# Patient Record
Sex: Female | Born: 1975 | Hispanic: Yes | Marital: Single | Smoking: Never smoker
Health system: Southern US, Community
[De-identification: ages and names within clinical notes are randomized; demographics above are authoritative.]

## PROBLEM LIST (undated history)

## (undated) ENCOUNTER — Inpatient Hospital Stay (HOSPITAL_COMMUNITY): Payer: Self-pay

## (undated) DIAGNOSIS — M545 Low back pain, unspecified: Secondary | ICD-10-CM

## (undated) DIAGNOSIS — R7303 Prediabetes: Secondary | ICD-10-CM

## (undated) DIAGNOSIS — D649 Anemia, unspecified: Secondary | ICD-10-CM

## (undated) DIAGNOSIS — I959 Hypotension, unspecified: Secondary | ICD-10-CM

## (undated) DIAGNOSIS — J45909 Unspecified asthma, uncomplicated: Secondary | ICD-10-CM

## (undated) DIAGNOSIS — K219 Gastro-esophageal reflux disease without esophagitis: Secondary | ICD-10-CM

## (undated) HISTORY — DX: Gastro-esophageal reflux disease without esophagitis: K21.9

## (undated) HISTORY — DX: Low back pain, unspecified: M54.50

## (undated) HISTORY — DX: Prediabetes: R73.03

## (undated) HISTORY — DX: Low back pain: M54.5

## (undated) HISTORY — PX: WISDOM TOOTH EXTRACTION: SHX21

## (undated) HISTORY — DX: Unspecified asthma, uncomplicated: J45.909

## (undated) HISTORY — PX: OTHER SURGICAL HISTORY: SHX169

## (undated) HISTORY — DX: Hypotension, unspecified: I95.9

## (undated) HISTORY — DX: Anemia, unspecified: D64.9

---

## 2001-04-13 ENCOUNTER — Encounter: Admission: RE | Admit: 2001-04-13 | Discharge: 2001-04-13 | Payer: Self-pay | Admitting: Obstetrics

## 2001-12-19 ENCOUNTER — Encounter: Admission: RE | Admit: 2001-12-19 | Discharge: 2001-12-19 | Payer: Self-pay | Admitting: *Deleted

## 2002-04-16 ENCOUNTER — Encounter: Admission: RE | Admit: 2002-04-16 | Discharge: 2002-04-16 | Payer: Self-pay | Admitting: Internal Medicine

## 2002-04-23 ENCOUNTER — Encounter: Admission: RE | Admit: 2002-04-23 | Discharge: 2002-04-23 | Payer: Self-pay | Admitting: Internal Medicine

## 2002-08-23 ENCOUNTER — Encounter: Admission: RE | Admit: 2002-08-23 | Discharge: 2002-08-23 | Payer: Self-pay | Admitting: Internal Medicine

## 2002-10-02 ENCOUNTER — Encounter: Admission: RE | Admit: 2002-10-02 | Discharge: 2002-10-02 | Payer: Self-pay | Admitting: Internal Medicine

## 2002-11-08 ENCOUNTER — Encounter: Admission: RE | Admit: 2002-11-08 | Discharge: 2002-11-08 | Payer: Self-pay | Admitting: Internal Medicine

## 2004-03-13 ENCOUNTER — Encounter: Admission: RE | Admit: 2004-03-13 | Discharge: 2004-03-13 | Payer: Self-pay | Admitting: Internal Medicine

## 2004-03-20 ENCOUNTER — Ambulatory Visit: Payer: Self-pay | Admitting: Internal Medicine

## 2004-05-12 ENCOUNTER — Encounter (INDEPENDENT_AMBULATORY_CARE_PROVIDER_SITE_OTHER): Payer: Self-pay | Admitting: *Deleted

## 2004-05-12 ENCOUNTER — Ambulatory Visit: Payer: Self-pay | Admitting: Internal Medicine

## 2004-06-02 ENCOUNTER — Ambulatory Visit: Payer: Self-pay | Admitting: Obstetrics & Gynecology

## 2005-06-24 ENCOUNTER — Ambulatory Visit: Payer: Self-pay | Admitting: Internal Medicine

## 2005-08-26 ENCOUNTER — Ambulatory Visit: Payer: Self-pay | Admitting: Internal Medicine

## 2005-08-31 ENCOUNTER — Ambulatory Visit: Payer: Self-pay | Admitting: Internal Medicine

## 2006-01-13 ENCOUNTER — Ambulatory Visit: Payer: Self-pay | Admitting: Internal Medicine

## 2006-07-26 ENCOUNTER — Ambulatory Visit: Payer: Self-pay | Admitting: Internal Medicine

## 2006-07-27 ENCOUNTER — Ambulatory Visit: Payer: Self-pay | Admitting: Hospitalist

## 2006-07-28 ENCOUNTER — Encounter (INDEPENDENT_AMBULATORY_CARE_PROVIDER_SITE_OTHER): Payer: Self-pay | Admitting: Internal Medicine

## 2006-08-25 ENCOUNTER — Encounter (INDEPENDENT_AMBULATORY_CARE_PROVIDER_SITE_OTHER): Payer: Self-pay | Admitting: Pulmonary Disease

## 2006-08-25 ENCOUNTER — Ambulatory Visit: Payer: Self-pay | Admitting: Internal Medicine

## 2006-08-25 DIAGNOSIS — R143 Flatulence: Secondary | ICD-10-CM

## 2006-08-25 DIAGNOSIS — R142 Eructation: Secondary | ICD-10-CM

## 2006-08-25 DIAGNOSIS — R141 Gas pain: Secondary | ICD-10-CM | POA: Insufficient documentation

## 2006-11-25 ENCOUNTER — Encounter: Payer: Self-pay | Admitting: *Deleted

## 2006-11-25 ENCOUNTER — Encounter (INDEPENDENT_AMBULATORY_CARE_PROVIDER_SITE_OTHER): Payer: Self-pay | Admitting: Pulmonary Disease

## 2006-11-25 ENCOUNTER — Ambulatory Visit (HOSPITAL_COMMUNITY): Admission: RE | Admit: 2006-11-25 | Discharge: 2006-11-25 | Payer: Self-pay | Admitting: *Deleted

## 2006-11-25 ENCOUNTER — Ambulatory Visit: Payer: Self-pay | Admitting: *Deleted

## 2006-12-06 ENCOUNTER — Ambulatory Visit: Payer: Self-pay | Admitting: Internal Medicine

## 2006-12-08 ENCOUNTER — Encounter: Admission: RE | Admit: 2006-12-08 | Discharge: 2006-12-08 | Payer: Self-pay | Admitting: Internal Medicine

## 2006-12-14 ENCOUNTER — Ambulatory Visit (HOSPITAL_COMMUNITY): Admission: RE | Admit: 2006-12-14 | Discharge: 2006-12-14 | Payer: Self-pay | Admitting: Pulmonary Disease

## 2006-12-14 ENCOUNTER — Ambulatory Visit: Payer: Self-pay | Admitting: Cardiology

## 2007-01-11 ENCOUNTER — Ambulatory Visit: Payer: Self-pay | Admitting: Internal Medicine

## 2007-01-11 DIAGNOSIS — M546 Pain in thoracic spine: Secondary | ICD-10-CM | POA: Insufficient documentation

## 2007-01-26 ENCOUNTER — Encounter (INDEPENDENT_AMBULATORY_CARE_PROVIDER_SITE_OTHER): Payer: Self-pay | Admitting: *Deleted

## 2007-01-26 ENCOUNTER — Ambulatory Visit: Payer: Self-pay | Admitting: Internal Medicine

## 2007-01-26 ENCOUNTER — Ambulatory Visit (HOSPITAL_COMMUNITY): Admission: RE | Admit: 2007-01-26 | Discharge: 2007-01-26 | Payer: Self-pay | Admitting: Internal Medicine

## 2007-01-26 LAB — CONVERTED CEMR LAB
ALT: 13 units/L (ref 0–35)
AST: 18 units/L (ref 0–37)
Albumin: 4.4 g/dL (ref 3.5–5.2)
BUN: 12 mg/dL (ref 6–23)
Basophils Absolute: 0 10*3/uL (ref 0.0–0.1)
Calcium: 8.9 mg/dL (ref 8.4–10.5)
Chloride: 104 meq/L (ref 96–112)
Hemoglobin: 12.8 g/dL (ref 12.0–15.0)
Lymphocytes Relative: 27 % (ref 12–46)
Lymphs Abs: 2.3 10*3/uL (ref 0.7–3.3)
Monocytes Absolute: 0.6 10*3/uL (ref 0.2–0.7)
Neutro Abs: 5.3 10*3/uL (ref 1.7–7.7)
Potassium: 3.8 meq/L (ref 3.5–5.3)
RDW: 13.2 % (ref 11.5–14.0)
Sed Rate: 10 mm/hr (ref 0–22)
WBC: 8.3 10*3/uL (ref 4.0–10.5)

## 2007-02-09 ENCOUNTER — Ambulatory Visit: Payer: Self-pay | Admitting: *Deleted

## 2007-03-29 ENCOUNTER — Encounter: Admission: RE | Admit: 2007-03-29 | Discharge: 2007-05-02 | Payer: Self-pay | Admitting: *Deleted

## 2007-05-12 ENCOUNTER — Encounter (INDEPENDENT_AMBULATORY_CARE_PROVIDER_SITE_OTHER): Payer: Self-pay | Admitting: Internal Medicine

## 2007-06-07 ENCOUNTER — Ambulatory Visit: Payer: Self-pay | Admitting: Internal Medicine

## 2007-10-12 ENCOUNTER — Ambulatory Visit: Payer: Self-pay | Admitting: Hospitalist

## 2007-10-12 ENCOUNTER — Encounter: Payer: Self-pay | Admitting: Internal Medicine

## 2007-10-14 LAB — CONVERTED CEMR LAB
ALT: 9 units/L (ref 0–35)
AST: 16 units/L (ref 0–37)
Albumin: 4 g/dL (ref 3.5–5.2)
Basophils Absolute: 0 10*3/uL (ref 0.0–0.1)
Calcium: 8.5 mg/dL (ref 8.4–10.5)
Chloride: 104 meq/L (ref 96–112)
Lymphocytes Relative: 40 % (ref 12–46)
Neutro Abs: 3.2 10*3/uL (ref 1.7–7.7)
Platelets: 143 10*3/uL — ABNORMAL LOW (ref 150–400)
Potassium: 3.8 meq/L (ref 3.5–5.3)
RDW: 13.3 % (ref 11.5–15.5)
Sed Rate: 11 mm/hr (ref 0–22)

## 2007-10-17 ENCOUNTER — Ambulatory Visit: Payer: Self-pay | Admitting: Hospitalist

## 2007-10-17 ENCOUNTER — Encounter: Payer: Self-pay | Admitting: Internal Medicine

## 2007-11-09 ENCOUNTER — Ambulatory Visit: Payer: Self-pay | Admitting: Infectious Disease

## 2007-12-29 ENCOUNTER — Ambulatory Visit: Payer: Self-pay | Admitting: Internal Medicine

## 2007-12-29 DIAGNOSIS — L255 Unspecified contact dermatitis due to plants, except food: Secondary | ICD-10-CM | POA: Insufficient documentation

## 2008-01-07 IMAGING — CR DG CHEST 2V
2 series · 2 of 2 positions shown · non-contrast
Comparison: None

CLINICAL DATA: Chest pain

CHEST - 2 VIEW

[w chest pa]
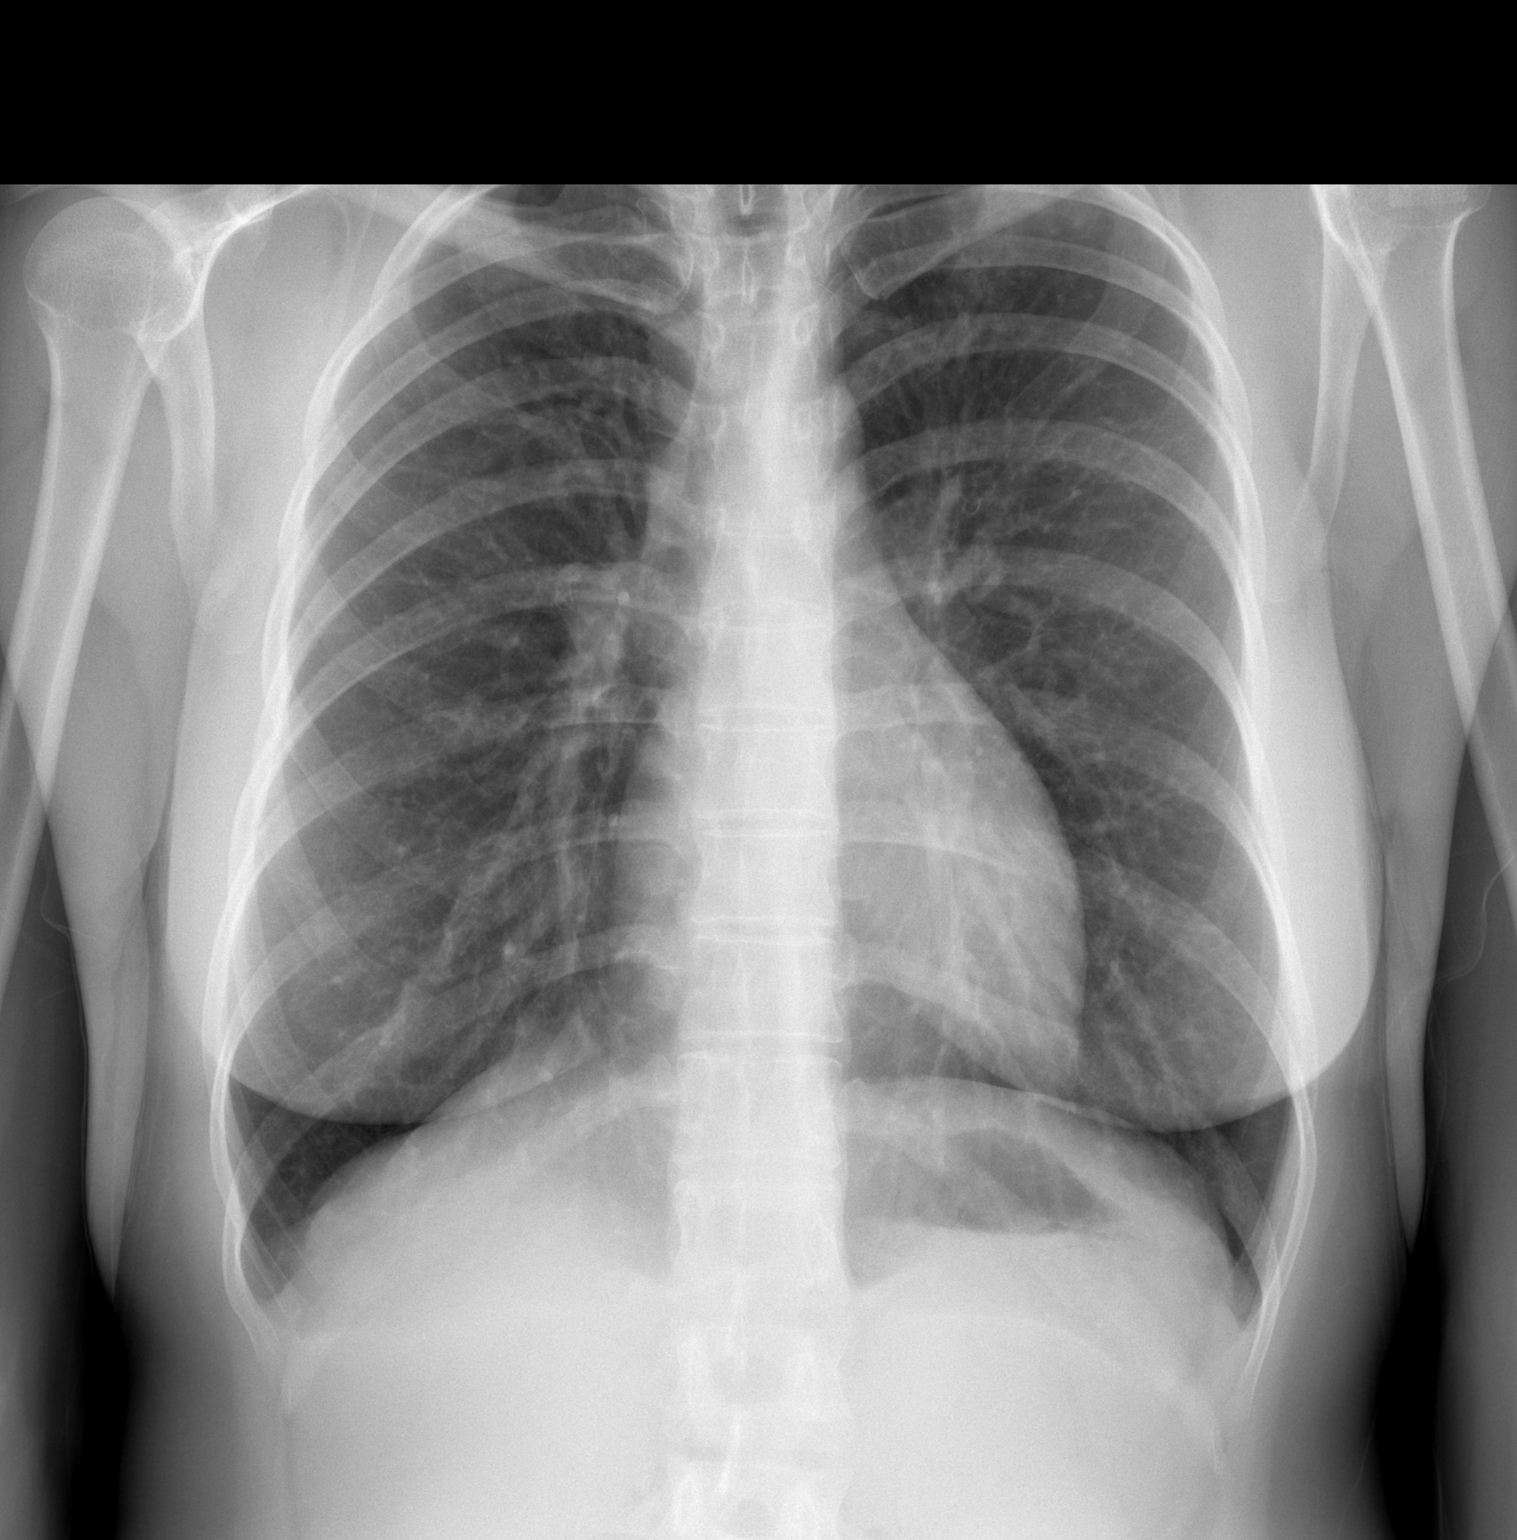

[w chest lat]
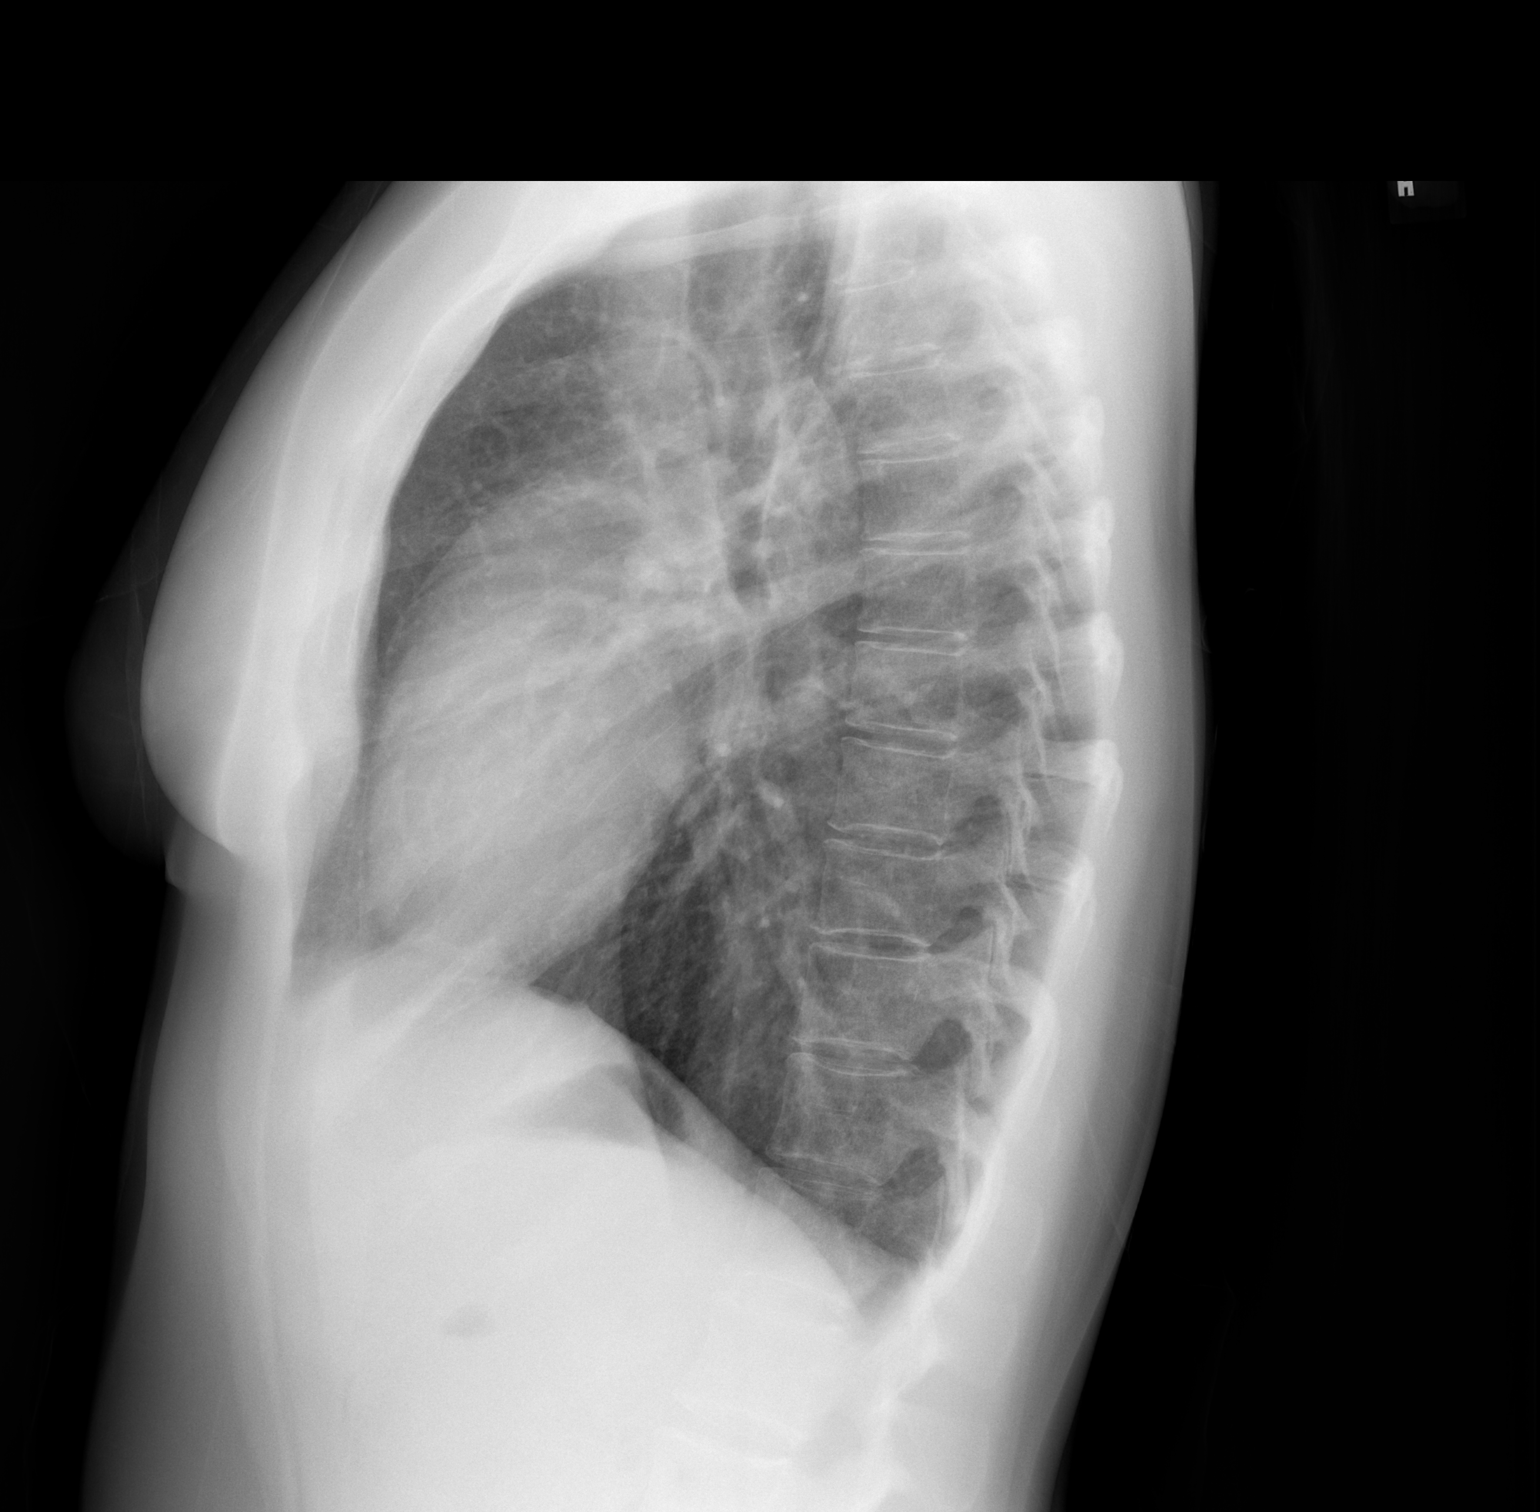

[2 of 2 positions shown; findings below may reference images not displayed]

FINDINGS: Heart size normal.

No effusion or edema.

No airspace opacities noted.

IMPRESSION

No active cardiopulmonary disease

## 2008-03-04 ENCOUNTER — Ambulatory Visit: Payer: Self-pay | Admitting: *Deleted

## 2008-04-04 ENCOUNTER — Other Ambulatory Visit: Admission: RE | Admit: 2008-04-04 | Discharge: 2008-04-04 | Payer: Self-pay | Admitting: Obstetrics and Gynecology

## 2008-04-04 ENCOUNTER — Ambulatory Visit: Payer: Self-pay | Admitting: Obstetrics and Gynecology

## 2008-04-18 ENCOUNTER — Ambulatory Visit: Payer: Self-pay | Admitting: Obstetrics and Gynecology

## 2008-05-07 ENCOUNTER — Encounter: Payer: Self-pay | Admitting: Internal Medicine

## 2008-09-05 ENCOUNTER — Encounter: Payer: Self-pay | Admitting: Internal Medicine

## 2008-09-05 ENCOUNTER — Ambulatory Visit: Payer: Self-pay | Admitting: *Deleted

## 2008-09-05 DIAGNOSIS — N898 Other specified noninflammatory disorders of vagina: Secondary | ICD-10-CM | POA: Insufficient documentation

## 2008-09-05 LAB — CONVERTED CEMR LAB: Chlamydia, DNA Probe: NEGATIVE

## 2008-10-24 ENCOUNTER — Ambulatory Visit: Payer: Self-pay | Admitting: Obstetrics and Gynecology

## 2008-10-24 ENCOUNTER — Encounter (INDEPENDENT_AMBULATORY_CARE_PROVIDER_SITE_OTHER): Payer: Self-pay | Admitting: Family Medicine

## 2008-10-25 ENCOUNTER — Encounter: Payer: Self-pay | Admitting: Internal Medicine

## 2008-11-08 ENCOUNTER — Encounter (INDEPENDENT_AMBULATORY_CARE_PROVIDER_SITE_OTHER): Payer: Self-pay | Admitting: Internal Medicine

## 2008-11-08 ENCOUNTER — Ambulatory Visit: Payer: Self-pay | Admitting: Internal Medicine

## 2008-11-08 DIAGNOSIS — J029 Acute pharyngitis, unspecified: Secondary | ICD-10-CM | POA: Insufficient documentation

## 2008-12-09 ENCOUNTER — Telehealth: Payer: Self-pay | Admitting: Internal Medicine

## 2009-03-20 ENCOUNTER — Ambulatory Visit: Payer: Self-pay | Admitting: Internal Medicine

## 2009-05-01 ENCOUNTER — Encounter: Payer: Self-pay | Admitting: Advanced Practice Midwife

## 2009-05-01 ENCOUNTER — Ambulatory Visit: Payer: Self-pay | Admitting: Obstetrics and Gynecology

## 2009-07-17 ENCOUNTER — Ambulatory Visit: Payer: Self-pay | Admitting: Internal Medicine

## 2009-09-26 ENCOUNTER — Ambulatory Visit: Payer: Self-pay | Admitting: Internal Medicine

## 2009-09-26 DIAGNOSIS — N3000 Acute cystitis without hematuria: Secondary | ICD-10-CM | POA: Insufficient documentation

## 2009-09-26 LAB — CONVERTED CEMR LAB
Bilirubin Urine: NEGATIVE
Glucose, Urine, Semiquant: NEGATIVE
Leukocytes, UA: NEGATIVE
Nitrite: NEGATIVE
Protein, U semiquant: NEGATIVE
Protein, ur: NEGATIVE mg/dL
Urine Glucose: NEGATIVE mg/dL
Urobilinogen, UA: 0.2 (ref 0.0–1.0)

## 2009-11-06 ENCOUNTER — Ambulatory Visit: Payer: Self-pay | Admitting: Obstetrics and Gynecology

## 2009-11-11 ENCOUNTER — Ambulatory Visit (HOSPITAL_COMMUNITY): Admission: RE | Admit: 2009-11-11 | Discharge: 2009-11-11 | Payer: Self-pay | Admitting: Obstetrics and Gynecology

## 2009-11-27 ENCOUNTER — Ambulatory Visit: Payer: Self-pay | Admitting: Obstetrics and Gynecology

## 2010-04-08 ENCOUNTER — Ambulatory Visit: Payer: Self-pay | Admitting: Internal Medicine

## 2010-04-08 DIAGNOSIS — R109 Unspecified abdominal pain: Secondary | ICD-10-CM | POA: Insufficient documentation

## 2010-04-08 LAB — CONVERTED CEMR LAB
AST: 19 units/L (ref 0–37)
Alkaline Phosphatase: 62 units/L (ref 39–117)
BUN: 13 mg/dL (ref 6–23)
Basophils Relative: 0 % (ref 0–1)
Creatinine, Ser: 0.63 mg/dL (ref 0.40–1.20)
Eosinophils Absolute: 0.1 10*3/uL (ref 0.0–0.7)
Eosinophils Relative: 1 % (ref 0–5)
Glucose, Bld: 103 mg/dL — ABNORMAL HIGH (ref 70–99)
HCT: 37.4 % (ref 36.0–46.0)
Hemoglobin: 12.7 g/dL (ref 12.0–15.0)
MCHC: 34 g/dL (ref 30.0–36.0)
MCV: 91 fL (ref >=78.0)
Monocytes Absolute: 0.7 10*3/uL (ref 0.1–1.0)
Monocytes Relative: 11 % (ref 3–12)
RBC: 4.11 M/uL (ref 3.87–5.11)

## 2010-05-22 ENCOUNTER — Telehealth (INDEPENDENT_AMBULATORY_CARE_PROVIDER_SITE_OTHER): Payer: Self-pay | Admitting: *Deleted

## 2010-05-25 ENCOUNTER — Ambulatory Visit: Payer: Self-pay | Admitting: Internal Medicine

## 2010-05-29 ENCOUNTER — Ambulatory Visit (HOSPITAL_COMMUNITY): Admission: RE | Admit: 2010-05-29 | Discharge: 2010-05-29 | Payer: Self-pay | Admitting: Internal Medicine

## 2010-06-18 ENCOUNTER — Ambulatory Visit: Payer: Self-pay | Admitting: Internal Medicine

## 2010-08-18 NOTE — Progress Notes (Signed)
Summary: PREVENTIVE COLONOSCOPY  Phone Note Outgoing Call   Call placed by: Shon Hough,  May 22, 2010 3:23 PM Summary of Call: Patient is under the age of 46.  No information in EMR or ECHART in regards to a colonscopy. Initial call taken by: Shon Hough,  May 22, 2010 3:24 PM

## 2010-08-18 NOTE — Assessment & Plan Note (Signed)
Summary: urine light red w/ pain [mkj]   Vital Signs:  Patient profile:   35 year old female Height:      64 inches (162.56 cm) Weight:      124.7 pounds BMI:     21.48 Temp:     98.1 degrees F (36.72 degrees C) oral Pulse rate:   78 / minute BP sitting:   101 / 57  (left arm)  Vitals Entered By: Chinita Pester RN (September 26, 2009 3:26 PM) CC: Started this morning, urine red and burning . 1 week prior, she had adb. pain, nausea, and high  temp. Is Patient Diabetic? No Pain Assessment Patient in pain? yes     Location: abdomen Intensity: 3 Type: dull Onset of pain  Intermittent Nutritional Status BMI of 19 -24 = normal  Have you ever been in a relationship where you felt threatened, hurt or afraid?No   Does patient need assistance? Functional Status Self care Ambulation Normal Comments Interpreter w/pt.   Primary Care Provider:  Hartley Barefoot MD  CC:  Started this morning, urine red and burning . 1 week prior, she had adb. pain, nausea, and and high  temp.Kelly Atkinson  History of Present Illness: 35 year old relatively healthy spanish speaking women with interpreter in the room comes to the clinic for new onset pain during micturition since morning. She also complains of lower abdominal pain since this morning. No c/o fever, chills. LMP was Feb 15th and she does not complain of any vaginal discharge. No other complaints today.  Depression History:      The patient denies a depressed mood most of the day and a diminished interest in her usual daily activities.         Preventive Screening-Counseling & Management  Alcohol-Tobacco     Alcohol drinks/day: 0     Smoking Status: never  Caffeine-Diet-Exercise     Caffeine use/day: 0     Does Patient Exercise: yes     Type of exercise: WALKING AND PUSH UPS     Exercise (avg: min/session): 30-60     Times/week: 3  Problems Prior to Update: 1)  Acute Pharyngitis  (ICD-462) 2)  Vaginal Discharge  (ICD-623.5) 3)  Poison Ivy  Dermatitis  (ICD-692.6) 4)  Health Maintenance Exam  (ICD-V70.0) 5)  Back Pain, Thoracic Region  (ICD-724.1) 6)  Abdominal Bloating  (ICD-787.3)  Medications Prior to Update: 1)  Prilosec Otc 20 Mg Tbec (Omeprazole Magnesium) .... Take 1 Tablet By Mouth Once A Day  Current Medications (verified): 1)  Prilosec Otc 20 Mg Tbec (Omeprazole Magnesium) .... Take 1 Tablet By Mouth Once A Day 2)  Ciprofloxacin Hcl 250 Mg Tabs (Ciprofloxacin Hcl) .... Take 1 Tablet By Mouth Two Times A Day 3)  Phenazo 200 Mg Tabs (Phenazopyridine Hcl) .... Take 1 Tablet By Mouth Three Times A Day After Meals  Allergies (verified): No Known Drug Allergies  Past History:  Past Medical History: Last updated: 02/09/2007 GERD Hx of abnormal PAP Smears, 2003 and 2005, last one in 08/2005 normal. Patient [facility] her PAP.  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 She is standing and does not have to slouch or bend her back while working. She has been working at [employer] fo 4 years.   Risk Factors: Alcohol Use: 0 (09/26/2009) Caffeine Use: 0 (09/26/2009) Exercise: yes (  09/26/2009)  Risk Factors: Smoking Status: never (09/26/2009)  Review of Systems      See HPI  Physical Exam  Additional Exam:  Gen: AOx3, in no acute distress Eyes: PERRL, EOMI ENT:MMM, No erythema noted in posterior pharynx Neck: No JVD, No LAP Chest: CTAB with  good respiratory effort CVS: regular rhythmic rate, NO M/R/G, S1 S2 normal Abdo: soft,ND, BS+x4, lower abdominal tenderness, No hepatosplenomegaly EXT: No odema noted Neuro: Non focal, gait is normal Skin: no rashes noted.    Impression & Recommendations:  Problem # 1:  ACUTE CYSTITIS (ICD-595.0) Acute uncomplicated cystitis with typical symptoms. Her updated medication list for this problem  includes:    Ciprofloxacin Hcl 250 Mg Tabs (Ciprofloxacin hcl) .Kelly Atkinson... Take 1 tablet by mouth two times a day  Orders: T-Urinalysis Dipstick only ([id number]) T-Urinalysis ([id number])  Problem # 2:  HEALTH MAINTENANCE EXAM (ICD-V70.0) Uptodate with vaccination.  Complete Medication List: 1)  Prilosec Otc 20 Mg Tbec (Omeprazole magnesium) .... Take 1 tablet by mouth once a day 2)  Ciprofloxacin Hcl 250 Mg Tabs (Ciprofloxacin hcl) .... Take 1 tablet by mouth two times a day 3)  Phenazo 200 Mg Tabs (Phenazopyridine hcl) .... Take 1 tablet by mouth three times a day after meals  Patient Instructions: 1)  Follow up with an appointmnet as needed. 2)  Take your antibiotic as prescribed until ALL of it is gone, but stop if you develop a rash or swelling and contact our office as soon as possible. Prescriptions: PHENAZO 200 MG TABS (PHENAZOPYRIDINE HCL) Take 1 tablet by mouth three times a day after meals  #9 x 0   Entered and Authorized by:   Lars Mage MD   Signed by:   Lars Mage MD on 09/26/2009   Method used:   Print then Give to Patient   RxID:   [id number] CIPROFLOXACIN HCL 250 MG TABS (CIPROFLOXACIN HCL) Take 1 tablet by mouth two times a day  #6 x 0   Entered and Authorized by:   Lars Mage MD   Signed by:   Lars Mage MD on 09/26/2009   Method used:   Print then Give to Patient   RxID:   [id number]  Process Orders Check Orders Results:     [facility]: ABN not required for this insurance Tests Sent for requisitioning (September 27, 2009 12:13 PM):     09/26/2009: [facility] -- T-Urinalysis [[id number]] (signed)    Prevention & Chronic Care Immunizations   Influenza vaccine: Fluvax 3+  (07/17/2009)    Tetanus booster: Not documented    Pneumococcal vaccine: Not documented  Other Screening   Pap smear: NEGATIVE FOR INTRAEPITHELIAL LESIONS OR MALIGNANCY.  (10/24/2008)   Smoking status: never  (09/26/2009)  Lipids   Total  Cholesterol: Not documented   LDL: Not documented   LDL Direct: Not documented   HDL: Not documented   Triglycerides: Not documented      Resource handout printed.   Laboratory Results   Urine Tests  Date/Time Recieved: 09/26/09  4:10PM Date/Time Reported: 09/26/09   4:10PM  Routine Urinalysis   Color: lt. yellow Glucose: negative   (Normal Range: Negative) Bilirubin: negative   (Normal Range: Negative) Ketone: negative   (Normal Range: Negative) Spec. Gravity: <1.005   (Normal Range: 1.003-1.035) Blood: trace-intact   (Normal Range: Negative) pH: 5.0   (Normal Range: 5.0-8.0) Protein: negative   (Normal Range: Negative) Urobilinogen: 0.2   (Normal Range: 0-1) Nitrite: negative   (Normal Range: Negative) Leukocyte  Esterace: negative   (Normal Range: Negative)

## 2010-08-18 NOTE — Assessment & Plan Note (Signed)
Summary: RA/NEEDS ROUTINE CHECKUP/CH   Vital Signs:  Patient profile:   35 year old female Weight:      131.8 pounds BMI:     22.71 O2 Sat:      100 % on RA Temp:     98.3 degrees F oral Pulse rate:   73 / minute BP sitting:   94 / 61  (right arm)  Vitals Entered By: Cynda Familia Duncan Dull) (April 08, 2010 2:52 PM)  O2 Flow:  RA CC: pain in the stomach,med refill- prilosec, no medicines for two months Is Patient Diabetic? No Pain Assessment Patient in pain? yes     Location: stomach Intensity: 4 Onset of pain  off and on Nutritional Status BMI of 19 -24 = normal  Have you ever been in a relationship where you felt threatened, hurt or afraid?No   Does patient need assistance? Functional Status Self care Ambulation Normal   Primary Care Provider:  Hartley Barefoot MD  CC:  pain in the stomach, med refill- prilosec, and no medicines for two months.  History of Present Illness: This is  35 year old female with h/o back pain   Abdominal pain: epigastric,  sharp in character, 5/10 in severity,  radiation to the side and then it goes down, no significant aggreviating or alleviating factors. Iit is always present. Worth with eating. It started a week ago. Complains about nausea with no vomit. Some diarrhea  2 weeks ago with no blood. Since then mucus in the stool.  Had epigastric pain in the past was put on Omeprazole 20 mg once a day but was not taking it since 2 month. Denies fevers or chills, recent travel, changes in diet.  Taking advil for pain occasionally.    Preventive Screening-Counseling & Management  Alcohol-Tobacco     Alcohol drinks/day: 0     Smoking Status: never  Allergies: No Known Drug Allergies  Physical Exam  General:  Well-developed,well-nourished,in no acute distress; alert,appropriate and cooperative throughout examination Lungs:  Normal respiratory effort, chest expands symmetrically. Lungs are clear to auscultation, no crackles or  wheezes. Heart:  Normal rate and regular rhythm. S1 and S2 normal without gallop, murmur, click, rub or other extra sounds. Abdomen:  Bowel sounds positive,abdomen soft and without masses, organomegaly or hernias noted. Epigastic tenderness, with no guarding, no rigidity.  Pulses:  R and L carotid,radial,dorsalis pedis and posterior tibial pulses are full and equal bilaterally Extremities:  No clubbing, cyanosis, edema, or deformity noted with normal full range of motion of all joints.     Impression & Recommendations:  Problem # 1:  ABDOMINAL PAIN (ICD-789.00) Epigastric pain most likey due to GERD. DD include duodenitis, gastritis and pancreatitis but higly unlikely with no significant positive finding on PE and presentation. Patient was prescribed Omeprazole 40 mg by mouth once a day and we will evaluate her in one month. If no improvement consider further work up for epigstric pain. Will check CBC, Cmet and Lipase today.   Orders: T-Comprehensive Metabolic Panel [phone]) T-CBC w/Diff [phone]) T-Lipase [phone])  Complete Medication List: 1)  Omeprazole 40 Mg Cpdr (Omeprazole) .... Take one tablet by mouth once a day.  Patient Instructions: 1)  Please schedule a follow-up appointment in 1 month with Dr Loistine Chance.  2)  Avoid foods high in acid (tomatoes, citrus juices, spicy foods). Avoid eating within two hours of lying down or before exercising. Do not over eat; try smaller more frequent meals. Elevate head of bed twelve inches when sleeping.  Do not take Aleve, Advil and other NSAIDs. Take Tylenol for pain.  Prescriptions: OMEPRAZOLE 40 MG CPDR (OMEPRAZOLE) Take one tablet by mouth once a day.  #30 x 3   Entered and Authorized by:   Almyra Deforest MD   Signed by:   Almyra Deforest MD on 04/08/2010   Method used:   Print then Give to Patient   RxID:   [id number]   Prevention & Chronic Care Immunizations   Influenza vaccine: Fluvax 3+  (07/17/2009)    Tetanus  booster: Not documented   Td booster deferral: Not indicated  (04/08/2010)   Tetanus booster due: 03/20/2012    Pneumococcal vaccine: Not documented  Other Screening   Pap smear: NEGATIVE FOR INTRAEPITHELIAL LESIONS OR MALIGNANCY.  (10/24/2008)   Pap smear due: 04/30/2010   Smoking status: never  (04/08/2010)  Lipids   Total Cholesterol: Not documented   LDL: Not documented   LDL Direct: Not documented   HDL: Not documented   Triglycerides: Not documented   Nursing Instructions: Give Flu vaccine today    Process Orders Check Orders Results:     [facility]: ABN not required for this insurance Order queued for requisitioning for [facility]: April 08, 2010 3:40 PM  Tests Sent for requisitioning (April 08, 2010 3:43 PM):     04/08/2010: [facility] -- T-Comprehensive Metabolic Panel [[id number]] (signed)     04/08/2010: [facility] -- T-CBC w/Diff [[id number]] (signed)     04/08/2010: [facility] -- Catalina Gravel [phone] (signed)     Appended Document: flu vaccine//kg    Clinical Lists Changes  Orders: Added new Service order of Admin 1st Vaccine ([id number]) - Signed Added new Service order of Flu Vaccine 73yrs + [phone]) - Signed Observations: Added new observation of FLU VAX VIS: 02/10/2010 version (04/08/2010 16:29) Added new observation of FLU VAXLOT: [id number] (04/08/2010 16:29) Added new observation of FLU VAXMFR: [employer] (04/08/2010 16:29) Added new observation of FLU VAX EXP: 01/16/2011 (04/08/2010 16:29) Added new observation of FLU VAX DSE: 0.37ml (04/08/2010 16:29) Added new observation of FLU VAX: Fluvax 3+ (04/08/2010 16:29)Flu Vaccine Consent Questions     Do you have a history of severe allergic reactions to this vaccine? no    Any prior history of allergic reactions to egg and/or gelatin? no    Do you have a sensitivity to the preservative Thimersol? no    Do you have a past  history of Guillan-Barre Syndrome? no    Do you currently have an acute febrile illness? no    Have you ever had a severe reaction to latex? no    Vaccine information given and explained to patient? yes    Are you currently pregnant? no    Lot Number:[id number]   Exp Date:01/16/2011   Manufacturer: [employer]    Site Given  Left Deltoid IM.Cynda Familia [facility])  April 08, 2010 4:29 PM  observation of FLU VAX DSE: 0.40ml (04/08/2010 16:29) Added new observation of FLU VAX: Fluvax 3+ (04/08/2010 16:29)     .opcflu

## 2010-08-18 NOTE — Assessment & Plan Note (Signed)
Summary: EST-1 MONTH F/U VISIT/CH   Vital Signs:  Patient profile:   35 year old female Height:      64 inches (162.56 cm) Weight:      128.6 pounds (58.45 kg) BMI:     22.15 Temp:     97.1 degrees F oral Pulse rate:   73 / minute BP sitting:   91 / 59  (right arm) Cuff size:   regular  Vitals Entered By: Chinita Pester RN (May 25, 2010 3:02 PM) CC: F/U visit; states abdomen pain has resolved. Is Patient Diabetic? No Pain Assessment Patient in pain? no      Nutritional Status BMI of 19 -24 = normal  Have you ever been in a relationship where you felt threatened, hurt or afraid?No   Does patient need assistance? Functional Status Self care Ambulation Normal Comments Interpreter w/pt.   Primary Care Provider:  Almyra Deforest MD  CC:  F/U visit; states abdomen pain has resolved.Marland Kitchen  History of Present Illness: This is  35 year old female with h/o back pain  who is here for a follow up appointment for abdominal pain.  She was started one month ago  on Omeprazol 40 mg for possible GERD and patient noted that she was taking it regularly. She reports that the pain is resolved but she still has a feeling of bloating with episodes of nausea especially with food. She further noted that has metal like take whenever she is stressed out. Patient denies vomiting, diarrhea, constipation, fevers or chills.   Note: CBC, CMET and Lipase at the last visit was within normal limits    Depression History:      The patient denies a depressed mood most of the day and a diminished interest in her usual daily activities.         Preventive Screening-Counseling & Management  Alcohol-Tobacco     Alcohol drinks/day: 0     Smoking Status: never  Caffeine-Diet-Exercise     Caffeine use/day: 0     Does Patient Exercise: yes     Type of exercise: WALKING AND PUSH UPS     Exercise (avg: min/session): 30-60     Times/week: 3  Current Medications (verified): 1)  Omeprazole 40 Mg Cpdr  (Omeprazole) .... Take One Tablet By Mouth Once A Day.  Allergies: No Known Drug Allergies  Review of Systems  The patient denies fever, weight loss, chest pain, dyspnea on exertion, peripheral edema, prolonged cough, hemoptysis, abdominal pain, and hematochezia.    Physical Exam  General:  Well-developed,well-nourished,in no acute distress; alert,appropriate and cooperative throughout examination Lungs:  Normal respiratory effort, chest expands symmetrically. Lungs are clear to auscultation, no crackles or wheezes. Heart:  Normal rate and regular rhythm. S1 and S2 normal without gallop, murmur, click, rub or other extra sounds. Abdomen:  Bowel sounds positive,abdomen soft and non-tender without masses, organomegaly or hernias noted. Pulses:  R and L carotid,radial, dorsalis pedis and posterior tibial pulses are full and equal bilaterally Extremities:  No clubbing, cyanosis, edema, or deformity noted with normal full range of motion of all joints.   Cervical Nodes:  No lymphadenopathy noted   Impression & Recommendations:  Problem # 1:  ABDOMINAL PAIN (ICD-789.00) Pain improved but patient still complaines about bloating and episodes of nauea with food. Furthermore reports about reflux especially if stressed out.  DD include GERD, Gastritis, Peptic ulcer disease  and Gallstones causing cholecystitis especially in the setting of nausea with food. Will get an ultrasound  of the Gallbladder and manage accordingly. If ultrasound is unrevealing I would consider EGD for further evaluation for possible ulcer disease or H. pylori infection.   Orders: Ultrasound (Ultrasound)  Complete Medication List: 1)  Omeprazole 40 Mg Cpdr (Omeprazole) .... Take one tablet by mouth once a day.  Patient Instructions: 1)  Please schedule a follow-up appointment in 2 weeks.   Orders Added: 1)  Ultrasound [Ultrasound] 2)  Est. Patient Level III [16109]    Prevention & Chronic Care Immunizations    Influenza vaccine: Fluvax 3+  (04/08/2010)    Tetanus booster: Not documented   Td booster deferral: Not indicated  (04/08/2010)   Tetanus booster due: 03/20/2012    Pneumococcal vaccine: Not documented  Other Screening   Pap smear: NEGATIVE FOR INTRAEPITHELIAL LESIONS OR MALIGNANCY.  (10/24/2008)   Pap smear due: 04/30/2010   Smoking status: never  (05/25/2010)  Lipids   Total Cholesterol: Not documented   LDL: Not documented   LDL Direct: Not documented   HDL: Not documented   Triglycerides: Not documented

## 2010-08-18 NOTE — Assessment & Plan Note (Signed)
Summary: ACUTE/ILLATH/R/S 2 WEEK RECHECK FROM 06-08-10/CH   Vital Signs:  Patient profile:   35 year old female Height:      64 inches Weight:      126.8 pounds BMI:     21.84 Temp:     97.8 degrees F oral Pulse rate:   78 / minute BP sitting:   96 / 58  (right arm)  Vitals Entered By: Filomena Jungling NT II (June 18, 2010 3:09 PM) CC: followup visit Is Patient Diabetic? No Pain Assessment Patient in pain? no      Nutritional Status BMI of 19 -24 = normal  Does patient need assistance? Functional Status Self care Ambulation Normal   Primary Care Provider:  Almyra Deforest MD  CC:  followup visit.  History of Present Illness: Reports resolution of LBP and abdominal pain. no other concerns.  Preventive Screening-Counseling & Management  Alcohol-Tobacco     Alcohol drinks/day: 0     Smoking Status: never  Caffeine-Diet-Exercise     Caffeine use/day: 0     Does Patient Exercise: yes     Type of exercise: WALKING AND PUSH UPS     Exercise (avg: min/session): 30-60     Times/week: 3  Current Problems (verified): 1)  Abdominal Pain  (ICD-789.00) 2)  Acute Cystitis  (ICD-595.0) 3)  Acute Pharyngitis  (ICD-462) 4)  Vaginal Discharge  (ICD-623.5) 5)  Poison Ivy Dermatitis  (ICD-692.6) 6)  Health Maintenance Exam  (ICD-V70.0) 7)  Back Pain, Thoracic Region  (ICD-724.1) 8)  Abdominal Bloating  (ICD-787.3)  Medications Prior to Update: 1)  Omeprazole 40 Mg Cpdr (Omeprazole) .... Take One Tablet By Mouth Once A Day.  Current Medications (verified): 1)  Omeprazole 40 Mg Cpdr (Omeprazole) .... Take One Tablet By Mouth Once A Day.  Allergies (verified): No Known Drug Allergies  Past History:  Past medical, surgical, family and social histories (including risk factors) reviewed for relevance to current acute and chronic problems.  Past Medical History: Reviewed history from 02/09/2007 and no changes required. GERD Hx of abnormal PAP Smears, 2003 and 2005, last  one in 08/2005 normal. Patient [facility] to [facility] department for her PAP.  Low back pain  Family History: Reviewed history from 12/29/2007 and no changes required. 1 son, healthy  Social History: Reviewed history from 01/26/2007 and no changes required. 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 She is standing and does not have to slouch or bend her back while working. She has been working at [employer] fo 4 years.   Review of Systems  The patient denies anorexia, fever, weight loss, weight gain, vision loss, decreased hearing, hoarseness, chest pain, syncope, dyspnea on exertion, peripheral edema, prolonged cough, headaches, hemoptysis, abdominal pain, melena, hematochezia, severe indigestion/heartburn, hematuria, incontinence, genital sores, muscle weakness, suspicious skin lesions, transient blindness, difficulty walking, depression, unusual weight change, abnormal bleeding, enlarged lymph nodes, and breast masses.         Last menses 06/09/2010 x 5 days; regular. Uses intravaginal contraception.  Physical Exam  General:  Well-developed,well-nourished,in no acute distress; alert,appropriate and cooperative throughout examination Mouth:  no gingival abnormalities.   Neck:  supple, full ROM, and no masses.   Chest Wall:  no deformities.   Lungs:  Normal respiratory effort, chest expands symmetrically. Lungs are clear to auscultation, no crackles or wheezes. Heart:  Normal rate and regular rhythm. S1 and S2 normal without gallop, murmur, click, rub or other extra sounds. Abdomen:  Bowel  sounds positive,abdomen soft and non-tender without masses, organomegaly or hernias noted. Rectal:  No external abnormalities noted. Normal sphincter tone. No rectal masses or tenderness. Msk:  No deformity or scoliosis noted of thoracic or lumbar spine.   Pulses:  R and L radial, dorsalis pedis and posterior tibial pulses are full and equal bilaterally Extremities:  No clubbing,  cyanosis, edema, or deformity noted with normal full range of motion of all joints.   Neurologic:  alert & oriented X3, strength normal in all extremities, sensation intact to light touch, sensation intact to pinprick, gait normal, and DTRs symmetrical and normal.   Skin:  Intact without suspicious lesions or rashes Psych:  Cognition and judgment appear intact. Alert and cooperative with normal attention span and concentration. No apparent delusions, illusions, hallucinations   Impression & Recommendations:  Problem # 1:  ABDOMINAL PAIN (ICD-789.00) Assessment Improved  likely IBS.Patient denies any "red flags" symptoms. Patient declined any medical therapy at this time. Instructed to call if changes her mind. Relaxation techniques discussed. Continue with Omeprazole for now on  as needed basis. Advised to decreased intake of fatty and spicy foods. I do not feel that the patient needs EGD at present. However, if Sx persist or worsen, need a further work up.  Discussed symptom control with the patient.   Problem # 2:  BACK PAIN, THORACIC REGION (ICD-724.1) Assessment: Improved  Likely of  a MSK nature. Resolved. Instructed to avoid heavy lifting; stretching exercises demonstrated the patient. Tyelnol OTC on as needed .  Discussed use of moist heat or ice, modified activities, medications, and stretching/strengthening exercises. Back care instructions given. To be seen in 2 weeks if no improvement; sooner if worsening of symptoms.   Complete Medication List: 1)  Omeprazole 40 Mg Cpdr (Omeprazole) .... Take one tablet by mouth once a day.   Orders Added: 1)  Est. Patient Level III [30865]    Prevention & Chronic Care Immunizations   Influenza vaccine: Fluvax 3+  (04/08/2010)    Tetanus booster: Not documented   Td booster deferral: Not indicated  (04/08/2010)   Tetanus booster due: 03/20/2012    Pneumococcal vaccine: Not documented  Other Screening   Pap smear: NEGATIVE FOR  INTRAEPITHELIAL LESIONS OR MALIGNANCY.  (10/24/2008)   Pap smear due: 04/30/2010   Smoking status: never  (06/18/2010)  Lipids   Total Cholesterol: Not documented   LDL: Not documented   LDL Direct: Not documented   HDL: Not documented   Triglycerides: Not documented

## 2010-11-26 ENCOUNTER — Encounter: Payer: Self-pay | Admitting: Internal Medicine

## 2010-11-26 ENCOUNTER — Ambulatory Visit (INDEPENDENT_AMBULATORY_CARE_PROVIDER_SITE_OTHER): Payer: Self-pay | Admitting: Internal Medicine

## 2010-11-26 VITALS — BP 98/60 | HR 60 | Temp 98.3°F | Ht 64.0 in | Wt 126.3 lb

## 2010-11-26 DIAGNOSIS — M79609 Pain in unspecified limb: Secondary | ICD-10-CM

## 2010-11-26 DIAGNOSIS — K089 Disorder of teeth and supporting structures, unspecified: Secondary | ICD-10-CM

## 2010-11-26 DIAGNOSIS — K0889 Other specified disorders of teeth and supporting structures: Secondary | ICD-10-CM

## 2010-11-26 DIAGNOSIS — M79673 Pain in unspecified foot: Secondary | ICD-10-CM | POA: Insufficient documentation

## 2010-11-26 DIAGNOSIS — B351 Tinea unguium: Secondary | ICD-10-CM | POA: Insufficient documentation

## 2010-11-26 MED ORDER — FLUCONAZOLE 200 MG PO TABS: ORAL_TABLET | ORAL | Status: DC

## 2010-11-26 MED ORDER — IBUPROFEN 600 MG PO TABS: 600.0000 mg | ORAL_TABLET | Freq: Four times a day (QID) | ORAL | Status: AC | PRN

## 2010-11-26 NOTE — Progress Notes (Signed)
  Subjective:    Patient ID: Kelly Atkinson, female    DOB: March 23, 1976, 35 y.o.   MRN: [id number]  HPI [name] is a pleasant 35 year old woman with no significant past medical history who comes in the clinic with chief complaint of bilateral foot and calf pain for about 2 weeks. Her work involves standing 7 hours every day and about 2 weeks before she started having pain in both her foods when she was working. The pain is sharp and shooting starts on the plantar aspect of the heel and goes up to the middle of the foot bilaterally. Denies any recent or past trauma or history of arthritis. The pain aggravated with weightbearing and gets better with rest and she never woke up at night with pain. First step in the morning his painful for her and the pain remains the same or gets a little worse with every step. Denies any sensation changes or weakness in bilateral lower extremities. Denies any fever, chills, night sweats, palpitations, headache, chest pain, shallow breath, abdominal pain, urinary abnormalities. She has an IUD in place and is not on any birth control pills.  Review of Systems    as per history of present illness Objective:   Physical Exam    Constitutional: Vital signs reviewed.  Patient is a well-developed and well-nourished in no acute distress and cooperative with exam. Alert and oriented x3.  Head: Normocephalic and atraumatic Ear: TM normal bilaterally Mouth: no erythema or exudates, MMM Eyes: PERRL, EOMI, conjunctivae normal, No scleral icterus.  Neck: Supple, Trachea midline normal ROM, No JVD, mass, thyromegaly, or carotid bruit present.  Cardiovascular: RRR, S1 normal, S2 normal, no MRG, pulses symmetric and intact bilaterally Pulmonary/Chest: CTAB, no wheezes, rales, or rhonchi Abdominal: Soft. Non-tender, non-distended, bowel sounds are normal, no masses, organomegaly, or guarding present.  GU: no CVA tenderness Musculoskeletal: No joint deformities, erythema,  or stiffness. Mild tenderness to palpation on the plantar aspect of bilateral feet from heel to middle of the foot and tenderness to squeezing of the calves bilaterally.  Neurological: A&O x3, Strenght is normal and symmetric bilaterally, cranial nerve II-XII are grossly intact, no focal motor deficit, sensory intact to light touch bilaterally.  Skin: Warm, dry and intact. Right big toenail onychomycosis. No rash, cyanosis, or clubbing.       Assessment & Plan:

## 2010-11-26 NOTE — Assessment & Plan Note (Signed)
She has a feeling that his multiple teeth about 17 years back in [place]. 1 of a lower molar filling is coming out. Also has a broken piece of that teeth. Will refer her to dental clinic for further evaluation and management.

## 2010-11-26 NOTE — Assessment & Plan Note (Signed)
Right toenail onychomycosis. We'll prescribe fluconazole 200 mg by mouth once weekly for 20 weeks and follow up after that.

## 2010-11-26 NOTE — Patient Instructions (Signed)
Please make followup appointment in 4-6 months. If the pain in your foot does not get better in next 2-3 weeks or he starts having pain in ankles and wrist, call the clinic to make an appointment. Please take ibuprofen as prescribed, 600 mg 3 times a day with meals for pain. Also for your infection in the big toe on right, we'll give your antibiotic named fluconazole 200 mg one tablet every Sunday for next 20 Sundays.

## 2010-11-26 NOTE — Assessment & Plan Note (Signed)
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 Also early RA cannot be ruled out as she's also complaining of problem with differential joint pain and bilaterally about 3 weeks before in the morning and of the complaints of intermittent bilateral ankle pains. Discussed in depth with her about the differential diagnosis and we will take Advil 600 mg by mouth 3 times a day for [redacted] weeks along with Prilosec 20 mg daily. If pain gets worse or she has pain in the wrist or ankle, call the clinic and make an appointment for further evaluation for rheumatoid arthritis.

## 2010-12-04 NOTE — Group Therapy Note (Signed)
Kelly Atkinson, Kelly Atkinson       ACCOUNT NO.:  [id number]   MEDICAL RECORD NO.:  [id number]          PATIENT TYPE:  WOC   LOCATION:  [facility]                   FACILITY:  [facility]   PHYSICIAN:  Elsie Lincoln, MD      DATE OF BIRTH:  [birth year]   DATE OF SERVICE:  06/02/2004                                    CLINIC NOTE   CHIEF COMPLAINT:  Referral of abnormal Pap smears.   HISTORY OF PRESENT ILLNESS:  Kelly Atkinson is a 35 year old Hispanic  female who per her physician's report had had an abnormal Pap smear  approximately 2 years ago and underwent colposcopy which evidently per the  patient showed some normal cellularity.  She had repeat Pap smear at a [facility] screening in April which she reports was abnormal, and then had a  repeat Pap smear with her primary care physician at the outpatient clinic  which showed ASCUS and the patient was referred here.  Of note, she started  her menses at age 48.  Her last menstrual period was May 22, 2004 and  says that they are very regular.  She denies risk factors such as tobacco  smoking or family history, though she has had four or five sexual partners  in her lifetime, and she is a G2 P2.  She is not currently on contraceptive.   PAST MEDICAL HISTORY:  For prior cervical abnormality but we do not have any  documentation of the specific abnormality.   FAMILY HISTORY:  Not significant.   SOCIAL HISTORY:  She does not smoke or drink or use drugs.   REVIEW OF SYSTEMS:  The patient says she gets occasional nausea and vomiting  prior to her menses and occasionally will have a vaginal odor.   OBJECTIVE:  VITAL SIGNS:  Temperature 97.7, pulse 68, blood pressure 90/63,  weight 120 pounds, height 5 feet.  PELVIC:  External genitalia appears normal.  She has pink vaginal mucosa  with a thin white discharge.  Cervical os appears fairly normal with  visualization of the squamocolumnar junction.  Do not see any evidence of  cysts, etc.   Cervical sweep was done for HPV testing.   ASSESSMENT AND PLAN:  A 35 year old Hispanic female with a history of  abnormal Pap smears, the last being atypical squamous cells of undetermined  significance.  HPV testing was sent today and she will be contacted for  further follow-up, and otherwise she has been scheduled for a 20-month repeat  Pap.      CM/MEDQ  D:  06/02/2004  T:  06/02/2004  Job:  [id number]

## 2010-12-10 ENCOUNTER — Encounter: Payer: Self-pay | Admitting: Internal Medicine

## 2010-12-10 DIAGNOSIS — M545 Low back pain, unspecified: Secondary | ICD-10-CM | POA: Insufficient documentation

## 2011-01-30 ENCOUNTER — Encounter: Payer: Self-pay | Admitting: Internal Medicine

## 2011-02-11 ENCOUNTER — Encounter: Payer: Self-pay | Admitting: Internal Medicine

## 2011-02-11 ENCOUNTER — Ambulatory Visit (INDEPENDENT_AMBULATORY_CARE_PROVIDER_SITE_OTHER): Payer: Self-pay | Admitting: Internal Medicine

## 2011-02-11 VITALS — BP 93/59 | HR 63 | Temp 97.8°F | Ht 60.0 in | Wt 127.2 lb

## 2011-02-11 DIAGNOSIS — N390 Urinary tract infection, site not specified: Secondary | ICD-10-CM

## 2011-02-11 DIAGNOSIS — K219 Gastro-esophageal reflux disease without esophagitis: Secondary | ICD-10-CM

## 2011-02-11 LAB — POCT URINALYSIS DIPSTICK
Glucose, UA: NEGATIVE
Ketones, UA: NEGATIVE
Leukocytes, UA: NEGATIVE
Spec Grav, UA: <=1.005
Urobilinogen, UA: 0.2

## 2011-02-11 MED ORDER — SULFAMETHOXAZOLE-TRIMETHOPRIM 800-160 MG PO TABS: 1.0000 | ORAL_TABLET | Freq: Two times a day (BID) | ORAL | Status: AC

## 2011-02-11 MED ORDER — PROMETHAZINE HCL 25 MG PO TABS: ORAL_TABLET | ORAL | Status: DC

## 2011-02-11 MED ORDER — TRAMADOL HCL 50 MG PO TABS: 50.0000 mg | ORAL_TABLET | Freq: Four times a day (QID) | ORAL | Status: DC | PRN

## 2011-02-11 MED ORDER — OMEPRAZOLE 20 MG PO CPDR: DELAYED_RELEASE_CAPSULE | ORAL | Status: DC

## 2011-02-11 NOTE — Assessment & Plan Note (Signed)
Patient reports a history of GERD and which she occasionally takes omeprazole which provides relief.  She is currently having bloating symptoms after eating. -Will start omeprazole 40 mg by mouth twice a day x14 days, then she can take 40 mg by mouth daily afterwards. -Patient was given a prescription of omeprazole

## 2011-02-11 NOTE — Progress Notes (Signed)
History of present illness: 35 year old woman without a significant past medical history presents today for pain in lower and upper abdomen that has been present for the past 2 weeks. She report that the pain is different than her regular menstrual cycle cramping. The pain is aching and nagging, constant, 4/ 10 in severity, no exacerbating or alleviating factors. She has been taking ibuprofen 600 mg which gives her relief. She states that the pain is not associated with food but she does have bloating in her stomach whenever she eats.  She also has hx of acid reflux in which she takes occasional omeprazole, with mild relief.  She denies any fever or chills, dysuria, burning sensation, or increase in frequency. She did have some nausea on Monday however it is now resolved. She reports that her last menstrual cycle was 01/28/2011 which lasted 3 days with normal bleeding. However she is has noted some pink vaginal discharge/spotting this morning.  She had an IUD placed about 4 years ago without any complication. She denies any history of STD or multiple sexual partners.       Review of system: As per history of present illness  PE: General: alert, well-developed, and cooperative to examination.  Lungs: normal respiratory effort, no accessory muscle use, normal breath sounds, no crackles, and no wheezes. Heart: normal rate, regular rhythm, no murmur, no gallop, and no rub.  Abdomen: soft,diffused tenderness but greater on LLQ , normal bowel sounds, no distention, no guarding, no rebound tenderness.  No CVA tenderness bilaterally.   GU: mild white discharge, IUD noted, scant blood on cervix os, mild erythema.  Left adnexal tenderness but no mass appreciated. No cervical motion tenderness.  No other masses/lesions noted in vagina  Msk: no joint swelling, no joint warmth, and no redness over joints.  Pulses: 2+ DP/PT pulses bilaterally Extremities: No cyanosis, clubbing, edema Neurologic: alert & oriented  X3, cranial nerves II-XII intact, strength normal in all extremities, sensation intact to light touch, and gait normal.  Skin: turgor normal and no rashes.  Psych: Oriented X3, memory intact for recent and remote, normally interactive, good eye contact, not anxious appearing, and not depressed appearing.

## 2011-02-11 NOTE — Patient Instructions (Signed)
Start taking Bactrim one tablet twice daily for 7 days I will call you with any abnormal lab results Drink plenty of fluid Take Tramadol 50mg  one tablet every 8 hours only if you have pain You can also take Promethazine 25mg  one-half tablet (1/2) whenever you have nausea or vomiting Please go to the ED if your pain is worsen or develop fever or shortness of breath Follow up in 2 weeks if symptoms are not resolved

## 2011-02-11 NOTE — Assessment & Plan Note (Addendum)
Likely UTI/cystitis. However other differential diagnoses include PID, bacterial vaginosis or other STDs, ectopic pregnancy. I perform pelvic exam today which shows scant amount of blood at cervical os with mild erythema with white discharge.  There is mild tenderness of left adnexal but no cervical motion tenderness. -Will start Bactrim DS one tablet twice daily x 7 days empirically while waiting for urinalysis and urine culture even though dipstick shows trace blood.  Hematuria is unlikely nephrolithiasis since she does have mild spotting on GU exam.   -Will send for wet mount, pap smear, and CG& chlamydia -I will call patient if any abnormal results -Encourage increase in fluid intake for hydration -I would get a pregnancy test if her symptoms are not resolved -If her symptoms worsen, she is instructed to go to ED for further evaluation to rule out PID or ectopic pregnancy.  Case discussed with Dr. Coralee Pesa

## 2011-02-12 LAB — WET PREP BY MOLECULAR PROBE
Gardnerella vaginalis: NEGATIVE
Trichomonas vaginosis: NEGATIVE

## 2011-02-12 LAB — URINALYSIS, ROUTINE W REFLEX MICROSCOPIC
Glucose, UA: NEGATIVE mg/dL
Hgb urine dipstick: NEGATIVE
Leukocytes, UA: NEGATIVE
Nitrite: NEGATIVE
Protein, ur: NEGATIVE mg/dL
pH: 7.5 (ref 5.0–8.0)

## 2011-02-12 LAB — MICROALBUMIN / CREATININE URINE RATIO: Microalb, Ur: 0.5 mg/dL (ref 0.00–1.89)

## 2011-02-12 LAB — GC/CHLAMYDIA PROBE AMP, GENITAL: Chlamydia, DNA Probe: NEGATIVE

## 2011-02-18 ENCOUNTER — Encounter: Payer: Self-pay | Admitting: *Deleted

## 2011-02-18 NOTE — Progress Notes (Unsigned)
Pt presents c/o R eye irritation and redness of sclera, states she first noticed problem Tuesday pm, feels like sand is in her eye, denies pain, slightly swollen, tried some kind of eyedrops but made eye burn, has not used since and she is instructed not to use them again. appt made for 8/3 at 1345 dr Thad Ranger

## 2011-02-18 NOTE — Progress Notes (Signed)
Agree. Can use warm compresses to soothe eye until appt.

## 2011-02-19 ENCOUNTER — Encounter: Payer: Self-pay | Admitting: Internal Medicine

## 2011-02-19 ENCOUNTER — Ambulatory Visit (INDEPENDENT_AMBULATORY_CARE_PROVIDER_SITE_OTHER): Payer: Self-pay | Admitting: Internal Medicine

## 2011-02-19 VITALS — BP 87/56 | HR 68 | Temp 97.5°F | Ht 60.0 in | Wt 124.9 lb

## 2011-02-19 DIAGNOSIS — H579 Unspecified disorder of eye and adnexa: Secondary | ICD-10-CM | POA: Insufficient documentation

## 2011-02-19 MED ORDER — CETIRIZINE HCL 10 MG PO TABS: 10.0000 mg | ORAL_TABLET | Freq: Every day | ORAL | Status: DC

## 2011-02-19 NOTE — Assessment & Plan Note (Signed)
The cause of this patient's itchy eye is not completely distinct, although likely an allergic component, which contributed by her exposure to cleaning products. She does not have eyelid swelling warmth or erythema to the indicator of blepharitis. There is no evidence of a chalazion. She does not indicate foreign body sensation or eye pain, therefore acute corneal abrasion is unlikely. - Will start Zyrtec. - Will recommend artificial tears versus another over-the-counter lubricating gel for symptom management. - Recommend warm compresses over the eye multiple times a day. - Patient is instructed that if she has change in vision, develops eye pain, persistence of symptoms, that she should return to the clinic for further evaluation.

## 2011-02-19 NOTE — Progress Notes (Signed)
  Subjective:    Patient ID: Kelly Atkinson, female    DOB: 1975/11/11, 35 y.o.   MRN: [id number]  HPI Pt is a 35 y.o. female who  has a past medical history of GERD (gastroesophageal reflux disease) and Low back pain. and presents to clinic today for the following:  1) Right eye injection - Pt has had erythema of right eye since Tuesday, which has been getting worse since that time. With associated itching over the puncta. No discharge, no pain, vision changes, known trauma.      Review of Systems     Objective:   Physical Exam        Assessment & Plan:

## 2011-02-19 NOTE — Progress Notes (Signed)
  Subjective:    Patient ID: Kelly Atkinson, female    DOB: [birth year], 35 y.o.   MRN: [id number]  HPI  Pt is a 35 y.o. female who  has a past medical history of GERD (gastroesophageal reflux disease) and Low back pain. and presents to clinic today for the following:    1) Right eye itching - patient notes right eye itching and redness over the medial aspect x3 days. Patient states that she awoke with the previously mentioned symptoms. She denies light sensitivity, eye pain, eye discharge, recent possible abrasions to the eye, foreign body sensation, swelling of the eyelids, increased tearing, change in vision. Confirms occasional nasal congestion. Of note, the patient did have exposure to other cleaner approximately 2 days prior to symptom onset. Has not previously had similar eye problems. Denies sick contacts with similar symptoms. Denies use of contact lenses.  Review of Systems Per HPI.  Current Outpatient Medications Medication Sig  . omeprazole (PRILOSEC) 20 MG capsule Take two tablets twice daily  . promethazine (PHENERGAN) 25 MG tablet Take one half tablet as needed for nausea and vomiting  . sulfamethoxazole-trimethoprim (SEPTRA DS) 800-160 MG per tablet Take 1 tablet by mouth 2 (two) times daily.  . traMADol (ULTRAM) 50 MG tablet Take 1 tablet (50 mg total) by mouth every 6 (six) hours as needed for pain.    Allergies Review of patient's allergies indicates no known allergies.  Past Medical History  Diagnosis Date  . GERD (gastroesophageal reflux disease)   . Low back pain        Objective:   Physical Exam  Eyes: Right eye exhibits no discharge and no exudate. Right conjunctiva is not injected. Right conjunctiva has a hemorrhage. Right eye exhibits normal extraocular motion and no nystagmus. Right pupil is round and reactive.  Fundoscopic exam:      The right eye shows no arteriolar narrowing, no AV nicking and no hemorrhage.     General: Vital signs reviewed  and noted. Well-developed, well-nourished, in no acute distress; alert, appropriate and cooperative throughout examination.  Head: Normocephalic, atraumatic.  Lungs:  Normal respiratory effort. Clear to auscultation BL without crackles or wheezes.  Heart: RRR. S1 and S2 normal without gallop, murmur, or rubs.  Abdomen:  BS normoactive. Soft, Nondistended, non-tender.  No masses or organomegaly.  Extremities: No pretibial edema.          Assessment & Plan:  Case and plan of care discussed with Dr. Blanch Media.

## 2011-02-19 NOTE — Patient Instructions (Addendum)
   Please follow-up at the clinic in 3 months with your primary care provider at which time we will reevaluate your eye, and preventative health care.  You can use over the counter artificial tears eye drops or ointment to help with the irritation sensation.  You can use warm-compresses over the eye 3-4 times a day.  If you have change in vision, develop eye pain, persistence of symptoms, that you should return to the clinic for further evaluation.   If symptoms worsen, or new symptoms arise, please call the clinic or go to the ER.  Please bring all of your medications in a bag to your next visit.

## 2011-03-05 ENCOUNTER — Encounter: Payer: Self-pay | Admitting: Internal Medicine

## 2011-03-05 ENCOUNTER — Ambulatory Visit (INDEPENDENT_AMBULATORY_CARE_PROVIDER_SITE_OTHER): Payer: Self-pay | Admitting: Internal Medicine

## 2011-03-05 VITALS — BP 77/50 | HR 64 | Temp 98.8°F | Ht 60.0 in | Wt 123.0 lb

## 2011-03-05 DIAGNOSIS — K047 Periapical abscess without sinus: Secondary | ICD-10-CM

## 2011-03-05 MED ORDER — AMOXICILLIN 500 MG PO CAPS: 500.0000 mg | ORAL_CAPSULE | Freq: Three times a day (TID) | ORAL | Status: DC

## 2011-03-05 MED ORDER — TRAMADOL HCL 50 MG PO TABS: 50.0000 mg | ORAL_TABLET | Freq: Four times a day (QID) | ORAL | Status: AC | PRN

## 2011-03-05 MED ORDER — CLINDAMYCIN HCL 300 MG PO CAPS: 300.0000 mg | ORAL_CAPSULE | Freq: Three times a day (TID) | ORAL | Status: AC

## 2011-03-05 MED ORDER — METRONIDAZOLE 500 MG PO TABS: 500.0000 mg | ORAL_TABLET | Freq: Three times a day (TID) | ORAL | Status: DC

## 2011-03-05 NOTE — Patient Instructions (Signed)
Please take your medicines as prescribed. Please schedule a follow up appointment with your PCP as needed.

## 2011-03-08 ENCOUNTER — Ambulatory Visit: Payer: Self-pay | Admitting: Internal Medicine

## 2011-03-08 DIAGNOSIS — K047 Periapical abscess without sinus: Secondary | ICD-10-CM | POA: Insufficient documentation

## 2011-03-08 NOTE — Assessment & Plan Note (Addendum)
Poor dentition with multiple caries involving  both upper and lower premolars and molars. On my exam she was noticed having some left-sided maxillary swelling and tenderness. She most likely has a dental abscess in upper second or third molar. Will treat her with 7 days of amoxicillin and clindamycin to cover both aerobes and anaerobes. Will get a dental referral for her. Will treat her pain symptomatically with tramadol.

## 2011-03-08 NOTE — Progress Notes (Signed)
  Subjective:    Patient ID: Kelly Atkinson, female    DOB: Nov 14, 1975, 35 y.o.   MRN: [id number]  HPI: 35 year old woman with no significant past medical history comes to the clinic for dental pain for couple of weeks.  She reports that she had left-sided dental pain that started a week ago and then she started eating from the right side and now she has been on the right side.  She denies noticing any bleeding gums. Also denies any systemic complaints including fever, chills, nausea vomiting or diarrhea.     Review of Systems  Constitutional: Negative for fever, chills, appetite change and fatigue.  HENT: Positive for dental problem. Negative for nosebleeds, congestion, rhinorrhea, sneezing, postnasal drip and tinnitus.   Eyes: Negative for photophobia and visual disturbance.  Respiratory: Negative for apnea, cough, choking and chest tightness.   Cardiovascular: Negative for chest pain, palpitations and leg swelling.  Gastrointestinal: Negative for nausea, vomiting, abdominal pain, diarrhea and constipation.  Genitourinary: Negative for dysuria and decreased urine volume.  Musculoskeletal: Negative for arthralgias.  Neurological: Negative for light-headedness and headaches.  Hematological: Negative for adenopathy.       Objective:   Physical Exam  Constitutional: She is oriented to person, place, and time. She appears well-developed and well-nourished. No distress.  HENT:  Head: Normocephalic and atraumatic.       Poor dentition, multiple dental caries in both lower and upper premolar and molars, mild left sided maxillary swelling and tenderness.  Eyes: Right eye exhibits no discharge. Left eye exhibits no discharge.  Neck: Normal range of motion. Neck supple. No JVD present. No tracheal deviation present. No thyromegaly present.  Cardiovascular: Normal rate, regular rhythm, normal heart sounds and intact distal pulses.  Exam reveals no gallop and no friction rub.   No murmur  heard. Pulmonary/Chest: Effort normal and breath sounds normal. No respiratory distress. She has no wheezes. She has no rales. She exhibits no tenderness.  Abdominal: Soft. Bowel sounds are normal. She exhibits no distension and no mass. There is no tenderness. There is no rebound and no guarding.  Musculoskeletal: Normal range of motion. She exhibits no edema and no tenderness.  Lymphadenopathy:    She has no cervical adenopathy.  Neurological: She is alert and oriented to person, place, and time. She has normal reflexes. She displays normal reflexes. No cranial nerve deficit. She exhibits normal muscle tone. Coordination normal.  Skin: Skin is warm. She is not diaphoretic.          Assessment & Plan:

## 2011-04-19 LAB — POCT PREGNANCY, URINE: Preg Test, Ur: NEGATIVE

## 2011-06-18 ENCOUNTER — Ambulatory Visit: Payer: Self-pay

## 2011-06-18 ENCOUNTER — Ambulatory Visit (INDEPENDENT_AMBULATORY_CARE_PROVIDER_SITE_OTHER): Payer: Self-pay

## 2011-06-18 DIAGNOSIS — Z23 Encounter for immunization: Secondary | ICD-10-CM

## 2012-03-14 ENCOUNTER — Ambulatory Visit (INDEPENDENT_AMBULATORY_CARE_PROVIDER_SITE_OTHER): Payer: Self-pay | Admitting: Internal Medicine

## 2012-03-14 ENCOUNTER — Encounter: Payer: Self-pay | Admitting: Internal Medicine

## 2012-03-14 VITALS — BP 91/55 | HR 69 | Temp 98.9°F | Wt 129.6 lb

## 2012-03-14 DIAGNOSIS — R300 Dysuria: Secondary | ICD-10-CM

## 2012-03-14 DIAGNOSIS — N390 Urinary tract infection, site not specified: Secondary | ICD-10-CM

## 2012-03-14 LAB — POCT URINALYSIS DIPSTICK
Leukocytes, UA: NEGATIVE
Nitrite, UA: NEGATIVE
Protein, UA: NEGATIVE
pH, UA: 7

## 2012-03-14 MED ORDER — NITROFURANTOIN MONOHYD MACRO 100 MG PO CAPS: 100.0000 mg | ORAL_CAPSULE | Freq: Two times a day (BID) | ORAL | Status: AC

## 2012-03-14 MED ORDER — NITROFURANTOIN MONOHYD MACRO 100 MG PO CAPS: 100.0000 mg | ORAL_CAPSULE | Freq: Two times a day (BID) | ORAL | Status: DC

## 2012-03-14 NOTE — Patient Instructions (Signed)
-  Please take Macrobid 100mg  BID x 7 days.  -If your symptoms do not resolve, please return to clinic.  If you have difficulty getting this medication, please call the clinic.  Please be sure to bring all of your medications with you to every visit.  Should you have any new or worsening symptoms, please be sure to call the clinic at [phone].

## 2012-03-14 NOTE — Progress Notes (Signed)
  Subjective:   Patient ID: Kelly Atkinson female   DOB: 05-Dec-1975 36 y.o.   MRN: [id number]  HPI: [name] is a 36 y.o. with h/o GERD.  Hurts in lower abdomen (?bladder); 3 days; polyuria and dysuria at the end of stream; happens yearly; +sexually active 3 days prior.  No pain with intercourse. No lubrication. +white phlegm like discharge, normally occurs 1 week after menstrual cycle.  No fever.  LMP: Feb 28, 2012  Past Medical History  Diagnosis Date  . GERD (gastroesophageal reflux disease)   . Low back pain    Current Outpatient Prescriptions  Medication Sig Dispense Refill  . omeprazole (PRILOSEC) 20 MG capsule Take two tablets twice daily  120 capsule  6   No family history on file. History   Social History  . Marital Status: Single    Spouse Name: N/A    Number of Children: N/A  . Years of Education: N/A   Occupational History  . Psychiatric nurse    Social History Main Topics  . Smoking status: Never Smoker   . Smokeless tobacco: None  . Alcohol Use: No  . Drug Use: None  . Sexually Active: None   Other Topics Concern  . 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  She is standing and does not have to slouch or bend her back while working.  She has been working at [employer] for 4 years.   Review of Systems: Constitutional: Denies fever, chills, diaphoresis, appetite change and fatigue.  HEENT: Denies photophobia, eye pain, redness, hearing loss, ear pain, congestion, sore throat, rhinorrhea, sneezing, mouth sores, trouble swallowing, neck pain, neck stiffness and tinnitus.   Respiratory: Denies SOB, DOE, cough, chest tightness,  and wheezing.   Cardiovascular: Denies chest pain, palpitations and leg swelling.  Gastrointestinal: Denies nausea, vomiting, abdominal pain, diarrhea, constipation, blood in stool  Genitourinary: as per HPI  Neurological: Denies dizziness, seizures, syncope,  weakness, light-headedness, numbness and headaches.   Objective:  Physical Exam: Filed Vitals:   03/14/12 1529  BP: 91/55  Pulse: 69  Temp: 98.9 F (37.2 C)  TempSrc: Oral  Weight: 129 lb 9.6 oz (58.786 kg)  SpO2: 98%   Constitutional: Vital signs reviewed.  Patient is a well-developed and well-nourished woman in no acute distress and cooperative with exam.  Mouth: no erythema or exudates, MMM Eyes: PERRL, EOMI, conjunctivae normal, No scleral icterus.  Neck: Supple, Trachea midline normal ROM, No JVD, mass, thyromegaly, or carotid bruit present.  Cardiovascular: RRR, S1 normal, S2 normal, no MRG, pulses symmetric and intact bilaterally Pulmonary/Chest: CTAB, no wheezes, rales, or rhonchi Abdominal: Soft. Non-tender, non-distended, bowel sounds are normal, no masses, organomegaly, or guarding present.  GU: no CVA tenderness  Neurological: A&O x3 Skin: Warm, dry and intact. No rash, cyanosis, or clubbing.  Psychiatric: Normal mood and affect. speech and behavior is normal. Judgment and thought content normal. Cognition and memory are normal.   Assessment & Plan:  Case and care discussed with Dr. Lonzo Cloud.  Patient to return at the first available appt with PCP for routine follow up.

## 2012-03-15 NOTE — Assessment & Plan Note (Addendum)
Likely UTI/cystitis.  Similar sx 02/11/11.  Though dipstick negative, the probability of cystitis is >90% in women who have dysuria & frequency without vaginal irritation/discharge.  I suspect leukorrhea is physiologic.  No CVA tenderness.  Symptoms are not suggestive of vaginitis so vaginal exam was not done.  No CVA tenderness/fever, so less likely pyelonephritis.    -Macrobid 100mg  BID x 7d (if she cannot afford this medication, she will call clinic, and I will prescribe bactrim, which was prescribed last year and was effective.) -If sx do not resolve, patient to return for vaginal exam - will then need to send for wet prep & GC/Chlamydia  Last LMP 02/28/12, patient not clear about how many days exactly her cycle is.  Regarding macrobid, it is a Class B drug in pregnancy: Pregnancy Risk Factor B (contraindicated at term)  Pregnancy Implications Adverse effects have not been observed in animal reproduction studies. Nitrofurantoin crosses the placenta, but very little reaches the amniotic fluid. Most published experiences with nitrofurantoin use during pregnancy have failed to identify any increased obstetric or teratogenic risks. Isolated reports of a potential increased risk for cardiovascular defects and a case report of upper limb paralysis have not been replicated in other studies. Use of nitrofurantoin during pregnancy has been generally well tolerated with rare reports of maternal toxicity including severe pulmonary reactions or hematologic adverse effects. Nitrofurantoin is contraindicated in pregnant patients at term (38-[redacted] weeks gestation), during labor and delivery, or when the onset of labor is imminent due to the possibility of hemolytic anemia in the neonate

## 2012-04-03 ENCOUNTER — Ambulatory Visit (INDEPENDENT_AMBULATORY_CARE_PROVIDER_SITE_OTHER): Payer: Self-pay | Admitting: Obstetrics & Gynecology

## 2012-04-03 ENCOUNTER — Encounter: Payer: Self-pay | Admitting: Obstetrics & Gynecology

## 2012-04-03 VITALS — BP 105/71 | HR 68 | Ht 61.25 in | Wt 128.4 lb

## 2012-04-03 DIAGNOSIS — R140 Abdominal distension (gaseous): Secondary | ICD-10-CM

## 2012-04-03 DIAGNOSIS — R141 Gas pain: Secondary | ICD-10-CM

## 2012-04-03 DIAGNOSIS — Z01419 Encounter for gynecological examination (general) (routine) without abnormal findings: Secondary | ICD-10-CM

## 2012-04-03 NOTE — Patient Instructions (Addendum)
Hinchazn o distensin abdominal (Bloating) La distensin abdominal es la sensacin de plenitud del abdomen. Puede ser que sienta que sus pantalones estn muy ajustados. A menudo la causa de la hinchazn es comer demasiado, retener lquidos o tener gases en el intestino. Tambin est ocasionado por tragar aire y comer alimentos que causan gases. El sndrome del colon irritable es una de una de las casuas ms comunes de la hinchazn. La constipacin tambin es una causa comn. A veces esto puede estar ocasionado por problemas ms serios. SNTOMAS Suele haber una sensacin de plenitud, y su abdomen salido para afuera. Puede haber molestias leves.  DIAGNSTICO En la mayora de los casos de distensin abdominal no suele realizarse ningn anlisis en particular. Si la enfermedad persiste y Advertising account executive, el mdico podr realizarle una prueba adicional.  TRATAMIENTO  No existe un tratamiento directo para la hinchazn.   No haga que el intestino se llene de gas. Puede ayudar a que esto no suceda. Evite mascar chicle y comer caramelos. Con esto se tiende a Psychologist, sport and exercise. El tragar aire tambin puede ser un hbito nervioso. Trate de evitarlo.   Evitar dietas altas en residuos tambin ayudar. Consuma alimentos con fibras solubles y sustituya los productos lcteos con soja y Harlon Ditty. Esto ayuda para el sndrome de colon irritable.   Si la causa es la constipacin, una dieta alta en residuos con ms fibra ayudar.   Evite las bebidas carbonatadas.   Los preparados de venta libre tambin le ayudarn a reducir gases. El farmacutico lo ayudar.  SOLICITE ATENCIN MDICA SI:  La distensin contina y Advertising account executive.   Nota que aumenta de [place].   Tiene prdida de peso pero la hinchazn empeora.   Tiene cambios en sus hbitos al ir de cuerpo, o siente nuseas o vmitos.  SOLICITE ATENCIN MDICA DE INMEDIATO SI:  Presenta dificultades para respirar o hinchazn en las piernas.   Siente un aumento del  dolor abdominal o dolor en el pecho.  Document Released: 10/01/2008 Document Revised: 06/24/2011 [facility] Patient Information 2012 [place], [place].

## 2012-04-03 NOTE — Progress Notes (Signed)
Patient ID: Kelly Atkinson, female   DOB: 02-05-1976, 36 y.o.   MRN: [id number]  Chief Complaint  Patient presents with  . Gynecologic Exam    also concerned about very painful periods    HPI Kelly Atkinson is a 36 y.o. female. G2P2 presents for annual GYN exam.  C/o daily pelvic/abd pain assoc with abd bloating.  Sx sometimes relieved with Tylenol.  HPI  Past Medical History  Diagnosis Date  . GERD (gastroesophageal reflux disease)   . Low back pain     No past surgical history on file.  No family history on file.  Social History History  Substance Use Topics  . Smoking status: Never Smoker   . Smokeless tobacco: Not on file  . Alcohol Use: No    Allergies  Allergen Reactions  . Ibuprofen Other (See Comments)    Causes throat to close    Current Outpatient Prescriptions  Medication Sig Dispense Refill  . Multiple Vitamins-Minerals (MULTIVITAMIN WITH MINERALS) tablet Take 1 tablet by mouth daily.      Marland Kitchen omeprazole (PRILOSEC) 20 MG capsule Take two tablets twice daily  120 capsule  6    Review of Systems Review of Systems  Blood pressure 105/71, pulse 68, height 5' 1.25" (1.556 m), weight 128 lb 6.4 oz (58.242 kg), last menstrual period 03/26/2012.  Physical Exam Physical Exam Lungs:  CTA CV: RRR Abd: soft, NT, ND, no rebound no guarding GU: EGBUS: no lesions Vagina: no blood in vault Cervix: no lesion; no mucopurulent d/c Uterus: small, mobile Adnexa: no masses; sl tender  Ext: no edema      Assessment    Annual GYN exam    Plan    F/u PAP and Cx H/pylori test F/u 1 year or sooner prn Flax seed 1 tbsp daily for 1 week  then 2 tbsp daily F/u with Primary care physician to discuss GI sx    HARRAWAY-SMITH, Kelly Atkinson 04/03/2012, 4:43 PM

## 2012-05-08 ENCOUNTER — Encounter: Payer: Self-pay | Admitting: Internal Medicine

## 2012-06-12 ENCOUNTER — Encounter: Payer: Self-pay | Admitting: Internal Medicine

## 2012-07-03 ENCOUNTER — Ambulatory Visit (INDEPENDENT_AMBULATORY_CARE_PROVIDER_SITE_OTHER): Payer: Self-pay | Admitting: Internal Medicine

## 2012-07-03 ENCOUNTER — Encounter: Payer: Self-pay | Admitting: Internal Medicine

## 2012-07-03 VITALS — BP 85/56 | HR 61 | Temp 97.2°F | Ht 61.0 in | Wt 129.3 lb

## 2012-07-03 DIAGNOSIS — K219 Gastro-esophageal reflux disease without esophagitis: Secondary | ICD-10-CM

## 2012-07-03 DIAGNOSIS — R109 Unspecified abdominal pain: Secondary | ICD-10-CM

## 2012-07-03 DIAGNOSIS — Z23 Encounter for immunization: Secondary | ICD-10-CM | POA: Insufficient documentation

## 2012-07-03 DIAGNOSIS — R61 Generalized hyperhidrosis: Secondary | ICD-10-CM

## 2012-07-03 LAB — COMPREHENSIVE METABOLIC PANEL
ALT: 12 U/L (ref 0–35)
AST: 17 U/L (ref 0–37)
Albumin: 4.4 g/dL (ref 3.5–5.2)
Alkaline Phosphatase: 59 U/L (ref 39–117)
CO2: 27 mEq/L (ref 19–32)
Calcium: 9 mg/dL (ref 8.4–10.5)
Chloride: 104 mEq/L (ref 96–112)
Creat: 0.72 mg/dL (ref 0.50–1.10)
Potassium: 3.9 mEq/L (ref 3.5–5.3)
Sodium: 139 mEq/L (ref 135–145)

## 2012-07-03 LAB — TSH: TSH: 2.769 u[IU]/mL (ref 0.350–4.500)

## 2012-07-03 LAB — CBC
Platelets: 164 10*3/uL (ref 150–400)
RDW: 13.7 % (ref 11.5–15.5)
WBC: 6.8 10*3/uL (ref 4.0–10.5)

## 2012-07-03 LAB — LIPID PANEL
Cholesterol: 118 mg/dL (ref 0–200)
HDL: 50 mg/dL (ref >39)
LDL Cholesterol: 54 mg/dL (ref 0–99)
Total CHOL/HDL Ratio: 2.4 Ratio

## 2012-07-03 NOTE — Assessment & Plan Note (Signed)
Unclear etiology at this point. Considering patient's acute onset of abdominal discomfort, dizziness and night sweats and with positive family history of GI cancer I will obtain a CT scan at this point. For further obtain basic metabolic panel and a CBC. Patient continues to have menstrual periods on a monthly basis with no significant problems. Pap smear was obtained in September and was within normal limits.

## 2012-07-03 NOTE — Progress Notes (Signed)
Subjective:   Patient ID: Kelly Atkinson female   DOB: 03-28-1976 36 y.o.   MRN: [id number]  HPI: [name] is a 36 y.o. female with past medical history significant as outlined below who presented to the clinic for a regular office visit but then patient reports that she has been experiencing some abdominal bloating since September of this year with no diarrhea or constipation as well as no nausea or vomiting or urinary changes. She further reports that her abdominal circumference has increased in size. Her pants are getting tighter although she has been trying to lose weight. She mentioned that she has been experiencing some night sweats again starting from September of this year. She noted that she normally feels always cold and did not change her temperature setting. 5 weeks ago as well as 3 weeks ago she experienced some dizziness at work but denies any syncope, chest pain or shortness of breath. She noted that she did not eat and drink much during that day. Her mother was diagnosed with some kind of stomach cancer and died within 66 months at age 42.    Past Medical History  Diagnosis Date  . GERD (gastroesophageal reflux disease)   . Low back pain    Current Outpatient Prescriptions  Medication Sig Dispense Refill  . Multiple Vitamins-Minerals (MULTIVITAMIN WITH MINERALS) tablet Take 1 tablet by mouth daily.      Marland Kitchen omeprazole (PRILOSEC) 20 MG capsule Take two tablets twice daily  120 capsule  6   No family history on file. History   Social History  . Marital Status: Single    Spouse Name: N/A    Number of Children: N/A  . Years of Education: N/A   Occupational History  . Psychiatric nurse    Social History Main Topics  . Smoking status: Never Smoker   . Smokeless tobacco: None  . Alcohol Use: No  . Drug Use: None  . Sexually Active: None   Other Topics Concern  . 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  She is standing and does not have to slouch or bend her back while working.  She has been working at [employer] for 4 years.   Review of Systems: Constitutional: Denies fever, chills, diaphoresis, appetite change and fatigue.  Respiratory: Denies SOB, DOE, cough, chest tightness,  and wheezing.   Cardiovascular: Denies chest pain, palpitations and leg swelling.  Gastrointestinal: Denies nausea, vomiting, abdominal pain, diarrhea, constipation, blood in stool  Genitourinary: Denies dysuria, urgency, frequency, hematuria, flank pain and difficulty urinating.  Skin: Denies pallor, rash and wound.  Neurological: Denies  seizures, syncope, weakness, numbness and headaches.  Hematological: Denies adenopathy. Easy bruising, personal or family bleeding history    Objective:  Physical Exam: Filed Vitals:   07/03/12 1549  BP: 85/56  Pulse: 61  Temp: 97.2 F (36.2 C)  TempSrc: Oral  Height: 5\' 1"  (1.549 m)  Weight: 129 lb 4.8 oz (58.65 kg)  SpO2: 100%   Constitutional: Vital signs reviewed.  Patient is a well-developed and well-nourished in no acute distress and cooperative with exam. Alert and oriented x3.  Neck: Supple,  No mass, thyromegaly appreciated Cardiovascular: RRR, S1 normal, S2 normal, Pulmonary/Chest: CTAB, no wheezes, rales, or rhonchi Abdominal: Soft. Non-tender, non-distended, bowel sounds are normal, no masses,  or guarding present.  GU: no CVA tenderness Hematology: no cervical adenopathy.  Neurological: A&O x3,  Skin: Warm, dry and intact.  No rash, cyanosis, or clubbing.  Psychiatric: Normal mood and affect.

## 2012-07-03 NOTE — Assessment & Plan Note (Signed)
Patient was started on Prilosec in the past but has been not using it since initially he did not improve his symptoms significantly. At this point patient noted that she has no further symptoms. H. pylori was obtained he in September and it was negative.

## 2012-07-03 NOTE — Assessment & Plan Note (Signed)
-

## 2012-07-03 NOTE — Assessment & Plan Note (Signed)
Unclear etiology at this point. Will obtain TSH

## 2012-07-03 NOTE — Assessment & Plan Note (Signed)
Flu shot given today appear

## 2012-07-05 ENCOUNTER — Other Ambulatory Visit (INDEPENDENT_AMBULATORY_CARE_PROVIDER_SITE_OTHER): Payer: PRIVATE HEALTH INSURANCE

## 2012-07-05 ENCOUNTER — Ambulatory Visit (HOSPITAL_COMMUNITY)
Admission: RE | Admit: 2012-07-05 | Discharge: 2012-07-05 | Disposition: A | Discharge status: Home / Self Care | Payer: PRIVATE HEALTH INSURANCE | Source: Ambulatory Visit | Attending: Internal Medicine | Admitting: Internal Medicine

## 2012-07-05 DIAGNOSIS — R142 Eructation: Secondary | ICD-10-CM | POA: Insufficient documentation

## 2012-07-05 DIAGNOSIS — I998 Other disorder of circulatory system: Secondary | ICD-10-CM | POA: Insufficient documentation

## 2012-07-05 DIAGNOSIS — R109 Unspecified abdominal pain: Secondary | ICD-10-CM

## 2012-07-05 DIAGNOSIS — R141 Gas pain: Secondary | ICD-10-CM | POA: Insufficient documentation

## 2012-07-05 DIAGNOSIS — R61 Generalized hyperhidrosis: Secondary | ICD-10-CM | POA: Insufficient documentation

## 2012-07-05 DIAGNOSIS — R143 Flatulence: Secondary | ICD-10-CM | POA: Insufficient documentation

## 2012-07-05 LAB — POCT URINE PREGNANCY: Preg Test, Ur: NEGATIVE

## 2012-07-05 MED ORDER — IOHEXOL 300 MG/ML  SOLN
80.0000 mL | Freq: Once | INTRAMUSCULAR | Status: AC | PRN
  Administered 2012-07-05: 80 mL via INTRAVENOUS

## 2012-10-04 ENCOUNTER — Ambulatory Visit: Payer: PRIVATE HEALTH INSURANCE

## 2013-02-13 ENCOUNTER — Ambulatory Visit: Payer: PRIVATE HEALTH INSURANCE | Admitting: Internal Medicine

## 2013-02-13 ENCOUNTER — Encounter: Payer: Self-pay | Admitting: Internal Medicine

## 2013-02-13 ENCOUNTER — Ambulatory Visit (INDEPENDENT_AMBULATORY_CARE_PROVIDER_SITE_OTHER): Payer: PRIVATE HEALTH INSURANCE | Admitting: Internal Medicine

## 2013-02-13 VITALS — BP 89/56 | HR 68 | Temp 96.9°F | Ht 63.0 in | Wt 127.9 lb

## 2013-02-13 DIAGNOSIS — J029 Acute pharyngitis, unspecified: Secondary | ICD-10-CM | POA: Insufficient documentation

## 2013-02-13 DIAGNOSIS — M545 Low back pain, unspecified: Secondary | ICD-10-CM

## 2013-02-13 DIAGNOSIS — Z0000 Encounter for general adult medical examination without abnormal findings: Secondary | ICD-10-CM | POA: Insufficient documentation

## 2013-02-13 DIAGNOSIS — B351 Tinea unguium: Secondary | ICD-10-CM

## 2013-02-13 MED ORDER — CYCLOBENZAPRINE HCL 5 MG PO TABS: 5.0000 mg | ORAL_TABLET | Freq: Three times a day (TID) | ORAL | Status: DC | PRN

## 2013-02-13 MED ORDER — BENZONATATE 100 MG PO CAPS: 100.0000 mg | ORAL_CAPSULE | Freq: Four times a day (QID) | ORAL | Status: DC | PRN

## 2013-02-13 MED ORDER — FLUCONAZOLE 100 MG PO TABS: 200.0000 mg | ORAL_TABLET | ORAL | Status: AC

## 2013-02-13 NOTE — Assessment & Plan Note (Addendum)
The patient has agreed to mail in her letter from the health department regarding her hepatitis B vaccine needs. I will review the letter and call her if she does indeed need to get the vaccine series from [name]. Her orange card will cover it if it is medically indicated.

## 2013-02-13 NOTE — Assessment & Plan Note (Addendum)
No red flag signs or symptoms. Paraspinal tenderness on exam. Patient is a Psychiatric nurse, and folds clothes all day. She reports improvement in her symptoms with occasional Flexeril as needed in the past. - Ordered Flexeril 5 mg as needed - Counseled the patient on the importance of not driving or operating machinery on this medication

## 2013-02-13 NOTE — Assessment & Plan Note (Signed)
Appearance consistent with recurrence of right toenail onychomycosis. - Ordered fluconazole 200 mg by mouth once weekly for 20 weeks - Counseled the patient on the importance of not getting pregnant while on this medication

## 2013-02-13 NOTE — Patient Instructions (Addendum)
Thank you for your visit. - You most likely have a viral throat infection. I will call you if the results of your strep test are positive. - Please take Tessalon Perles for cough. Your infection will get better in a couple of days. - I have prescribed Flexeril for your back pain. Please do not drive or operate machinery after taking this medicine. It may make you drowsy. - I have prescribed Diflucan for your toenail infection. Please take it once a week. It is important that you not become pregnant while taking this medication. - Please mail in the letter from health department about your hepatitis B vaccine. I will review it and decide if you need the vaccine.

## 2013-02-13 NOTE — Assessment & Plan Note (Addendum)
Most likely a viral pharyngitis given rhinorrhea, cough, sick contacts. Patient does not meet the Center criteria for group A strep pharyngitis as she has a cough, has no fever, and has no tonsillar exudate. However, she does meet one of the criteria because of her cervical adenopathy. Also, the sudden onset of her illness is more consistent with strep pharyngitis. That being said, I don't think treatment with antibiotics is indicated at this time. - Rapid strep test ordered. Will followup results and treat if needed. - Ordered Tessalon Perles for cough. - Reassured patient that symptoms should resolve with just symptomatic treatment in several days.  ADDENDUM: Strep test negative.

## 2013-02-13 NOTE — Progress Notes (Signed)
  Subjective:    Patient ID: Kelly Atkinson, female    DOB: 1975/08/06, 37 y.o.   MRN: [id number]  HPI Kelly Atkinson is a 37 year old female with past medical history of GERD and low back pain who presents today for an acute complaint of sore throat.  Her symptoms began suddenly one week ago. She reports rhinorrhea, chills, sore throat, and nonproductive cough. She denies fever, conjunctivitis, nausea, vomiting, diarrhea, urinary symptoms. Her son has been sick for the past week and a half with a similar illness. Denies recent travel. She takes no medications.  She also complains of some low back pain she has had for many years. Denies radiating pain, weakness, numbness, bowel or bladder incontinence. She was prescribed Flexeril as needed in the past and that helped with acute flares of her pain. She is requesting a refill of this.  She also reports recurrence of a fungal infection of her right big toenail, successfully treated previously in 2012 with Diflucan. She would like to try taking this medication again.  She asked about the possibility of receiving the hepatitis B vaccine here in clinic. Apparently she received some sort of letter from the health department stating that she needed this vaccine. She did not bring the letter today.   Review of Systems  Constitutional: Positive for chills. Negative for fever.  HENT: Positive for congestion, sore throat, rhinorrhea and postnasal drip. Negative for ear pain, nosebleeds, neck stiffness, sinus pressure and ear discharge.   Eyes: Negative for visual disturbance.  Respiratory: Positive for cough. Negative for shortness of breath and wheezing.   Cardiovascular: Negative for chest pain.  Gastrointestinal: Negative for nausea, vomiting, abdominal pain and diarrhea.  Endocrine: Negative for polyuria.  Genitourinary: Negative for dysuria.  Musculoskeletal: Negative for myalgias, joint swelling and arthralgias.  Skin: Negative for rash.   Neurological: Negative for dizziness, weakness, numbness and headaches.  Hematological: Negative for adenopathy.       Objective:   Physical Exam  Constitutional: She is oriented to person, place, and time. She appears well-developed and well-nourished. No distress.  HENT:  Head: Normocephalic and atraumatic.  Right Ear: Tympanic membrane and external ear normal.  Left Ear: Tympanic membrane and external ear normal.  Nose: Rhinorrhea present.  Mouth/Throat: Uvula is midline and mucous membranes are normal. No oropharyngeal exudate, posterior oropharyngeal edema, posterior oropharyngeal erythema or tonsillar abscesses.  Eyes: Conjunctivae are normal. Right eye exhibits no discharge. Left eye exhibits no discharge.  Neck: Normal range of motion. Neck supple.  Cardiovascular: Normal rate, regular rhythm, normal heart sounds and intact distal pulses.  Exam reveals no gallop and no friction rub.   No murmur heard. Pulmonary/Chest: Effort normal and breath sounds normal. No respiratory distress. She has no wheezes. She has no rales. She exhibits no tenderness.  Musculoskeletal: Normal range of motion. She exhibits no edema and no tenderness.       Lumbar back: She exhibits normal range of motion, no swelling, no deformity and no spasm.  Some pain with palpation of paraspinal muscles  Lymphadenopathy:    She has cervical adenopathy (small, mobile, tender nodes in anterior cervical chain bilaterally).  Neurological: She is alert and oriented to person, place, and time.  Skin: Skin is warm and dry. Rash (onychomycosis localized to right first toenail; no erythema, swelling, fluctuance) noted.          Assessment & Plan:

## 2013-02-13 NOTE — Progress Notes (Signed)
Interpreter Wyvonnia Dusky for Dr Claudell Kyle

## 2013-02-15 NOTE — Progress Notes (Signed)
I saw and evaluated the patient.  I personally confirmed the key portions of the history and exam documented by Dr. Cater and I reviewed pertinent patient test results.  The assessment, diagnosis, and plan were formulated together and I agree with the documentation in the resident's note. 

## 2013-04-10 ENCOUNTER — Ambulatory Visit: Payer: No Typology Code available for payment source

## 2013-04-11 ENCOUNTER — Ambulatory Visit: Payer: Self-pay

## 2013-04-11 ENCOUNTER — Ambulatory Visit: Payer: No Typology Code available for payment source

## 2013-04-11 ENCOUNTER — Encounter: Payer: Self-pay | Admitting: Internal Medicine

## 2013-04-11 ENCOUNTER — Ambulatory Visit (INDEPENDENT_AMBULATORY_CARE_PROVIDER_SITE_OTHER): Payer: No Typology Code available for payment source | Admitting: Internal Medicine

## 2013-04-11 VITALS — BP 100/64 | HR 70 | Temp 98.9°F | Wt 128.7 lb

## 2013-04-11 DIAGNOSIS — Z23 Encounter for immunization: Secondary | ICD-10-CM

## 2013-04-11 DIAGNOSIS — R109 Unspecified abdominal pain: Secondary | ICD-10-CM

## 2013-04-11 LAB — LIPASE: Lipase: 13 U/L (ref 0–75)

## 2013-04-11 LAB — COMPLETE METABOLIC PANEL WITH GFR
BUN: 13 mg/dL (ref 6–23)
CO2: 28 mEq/L (ref 19–32)
Calcium: 9.4 mg/dL (ref 8.4–10.5)
Chloride: 104 mEq/L (ref 96–112)
Creat: 0.62 mg/dL (ref 0.50–1.10)
GFR, Est African American: >89 mL/min

## 2013-04-11 LAB — URINALYSIS, ROUTINE W REFLEX MICROSCOPIC
Glucose, UA: NEGATIVE mg/dL
Hgb urine dipstick: NEGATIVE
Ketones, ur: NEGATIVE mg/dL
Leukocytes, UA: NEGATIVE
Nitrite: NEGATIVE
Protein, ur: NEGATIVE mg/dL

## 2013-04-11 NOTE — Patient Instructions (Signed)
We will call you to schedule [employer].

## 2013-04-11 NOTE — Assessment & Plan Note (Addendum)
Patient reports acute onset of abdominal pain that has lasted for 7 days associated with Nausea.  Her pain is across upper abdominal quadrants and worse with food.  She has previously had negative workup for abdominal pain and dizziness with a negative CT scan.  Her FDLMP was 4 days ago and she reports she is still menstruating.  She does have a history of GERD but denies reflux symptoms and does not take any medication. -Obtain CMP, Lipase, and Abdominal U/S to evaluate for hepatobiliary dysfunction. -U/A w/ reflex and GC/C urine probe to evaluate for UTI/PID

## 2013-04-11 NOTE — Progress Notes (Signed)
Patient ID: Kelly Atkinson, female   DOB: 09/01/1975, 37 y.o.   MRN: [id number]   Subjective:   Patient ID: Kelly Atkinson female   DOB: 1975-07-23 37 y.o.   MRN: [id number]  HPI: Kelly Atkinson is a 37 y.o. spanish speaking female with a PMH of GERD, Low back pain, and abdominal pains.  She presents today c/o 1 week of Upper quadrant abdominal pain associated with nausea and dizziness.  Patient does have a history of abdominal pain that has been worked up without clear cause.  Her current pain she reports is made worse with eating, but also at times from laying down or sitting up.  She denies any reflux symptoms and does not take any GERD medication.  She does report occasional sensation of her stomach swelling but denies any vomiting, diarrhea, and only reports one episode of constipation 2 days ago.  Sh e reports that years ago she was treated with a medication that helped but she cannot remember the name of the medication. FDLMP: 04/07/13, does not use BC, is sexually active, reports she is currently menstruating.    Past Medical History  Diagnosis Date  . GERD (gastroesophageal reflux disease)   . Low back pain    Current Outpatient Prescriptions  Medication Sig Dispense Refill  . benzonatate (TESSALON PERLES) 100 MG capsule Take 1 capsule (100 mg total) by mouth every 6 (six) hours as needed for cough.  30 capsule  1  . cyclobenzaprine (FLEXERIL) 5 MG tablet Take 1 tablet (5 mg total) by mouth every 8 (eight) hours as needed for muscle spasms.  30 tablet  3  . Multiple Vitamins-Minerals (MULTIVITAMIN WITH MINERALS) tablet Take 1 tablet by mouth daily.       No current facility-administered medications for this visit.   Family History  Problem Relation Age of Onset  . Diabetes Mother   . Hypertension Mother   . Cancer Mother     [place]  . Stroke Father     Died    History   Social History  . Marital Status: Single    Spouse Name: N/A    Number of  Children: N/A  . Years of Education: N/A   Occupational History  . Psychiatric nurse    Social History Main Topics  . Smoking status: Never Smoker   . Smokeless tobacco: None  . Alcohol Use: No  . Drug Use: None  . Sexual Activity: None   Other Topics Concern  . 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  She is standing and does not have to slouch or bend her back while working.  She has been working at [employer] for 4 years.   Review of Systems: Review of Systems  Constitutional: Negative for fever, chills, weight loss and malaise/fatigue.  HENT: Negative for congestion.   Eyes: Negative for blurred vision.  Respiratory: Negative for cough, sputum production and shortness of breath.   Cardiovascular: Negative for chest pain, palpitations and leg swelling.  Gastrointestinal: Positive for nausea, abdominal pain and constipation. Negative for heartburn, vomiting and diarrhea.  Genitourinary: Negative for dysuria, frequency, hematuria and flank pain.  Skin: Negative for rash.  Neurological: Positive for dizziness. Negative for headaches.  Psychiatric/Behavioral: Negative for depression.    Objective:  Physical Exam: Filed Vitals:   04/11/13 1523 04/11/13 1524 04/11/13 1527 04/11/13 1529  BP: 93/60 99/65 104/70 100/64  Pulse: 72 67 66 70  Temp:  98.9 F (37.2 C)     TempSrc: Oral     Weight: 128 lb 11.2 oz (58.378 kg)     SpO2: 100%     Physical Exam  Nursing note and vitals reviewed. Constitutional: She is well-developed, well-nourished, and in no distress. No distress.  HENT:  Head: Normocephalic and atraumatic.  Eyes: Conjunctivae are normal.  Cardiovascular: Normal rate, regular rhythm, normal heart sounds and intact distal pulses.   No murmur heard. Pulmonary/Chest: Effort normal and breath sounds normal. No respiratory distress. She has no wheezes. She has no rales.  Abdominal: Soft. Bowel sounds are normal. She  exhibits no distension. There is tenderness (RUQ, LUQ and Epigastric region Mild TTP, Negative Murphey's sign). There is no rebound and no guarding.  Musculoskeletal: She exhibits no edema.  Skin: Skin is warm and dry. She is not diaphoretic.  Psychiatric: Affect and judgment normal.     Assessment & Plan:   See Problem Based Assessment and Plan No orders of the defined types were placed in this encounter.

## 2013-04-13 ENCOUNTER — Ambulatory Visit (HOSPITAL_COMMUNITY)
Admission: RE | Admit: 2013-04-13 | Discharge: 2013-04-13 | Disposition: A | Discharge status: Home / Self Care | Payer: No Typology Code available for payment source | Source: Ambulatory Visit | Attending: Internal Medicine | Admitting: Internal Medicine

## 2013-04-13 DIAGNOSIS — R109 Unspecified abdominal pain: Secondary | ICD-10-CM

## 2013-04-13 DIAGNOSIS — R110 Nausea: Secondary | ICD-10-CM | POA: Insufficient documentation

## 2013-04-13 NOTE — Progress Notes (Signed)
I saw and evaluated the patient.  I personally confirmed the key portions of the history and exam documented by Dr. Hoffman and I reviewed pertinent patient test results.  The assessment, diagnosis, and plan were formulated together and I agree with the documentation in the resident's note. 

## 2013-04-16 ENCOUNTER — Telehealth: Payer: Self-pay | Admitting: Internal Medicine

## 2013-04-16 DIAGNOSIS — R109 Unspecified abdominal pain: Secondary | ICD-10-CM

## 2013-04-16 NOTE — Telephone Encounter (Signed)
Called patient and updated her on test/ U/S results. Patient still reports abdominal pain but is decreased since last week.  Will refer to GI.

## 2013-04-16 NOTE — Assessment & Plan Note (Signed)
Abd U/S neg, CMP, Lipase, and UA negative, GC/C probe negative. Called patient she reports she still has abdominal pain but is decreased. Patient does have history of GERD and is originally from [place].  Will refer to GI as patient may benefit from H.Pylori testing and evaluation for PUD.  Will hold off on prescribing PPI until patient has seen GI as this could decrease sensitivity of H Pylori testing.

## 2013-04-25 ENCOUNTER — Ambulatory Visit: Payer: No Typology Code available for payment source | Admitting: Internal Medicine

## 2013-04-27 ENCOUNTER — Encounter: Payer: Self-pay | Admitting: Gastroenterology

## 2013-05-29 ENCOUNTER — Ambulatory Visit (INDEPENDENT_AMBULATORY_CARE_PROVIDER_SITE_OTHER): Payer: No Typology Code available for payment source | Admitting: Gastroenterology

## 2013-05-29 ENCOUNTER — Encounter: Payer: Self-pay | Admitting: Gastroenterology

## 2013-05-29 VITALS — BP 108/70 | HR 66 | Ht 62.5 in | Wt 130.0 lb

## 2013-05-29 DIAGNOSIS — R109 Unspecified abdominal pain: Secondary | ICD-10-CM

## 2013-05-29 NOTE — Progress Notes (Signed)
HPI: This is a  very pleasant 38 year old Spanish-speaking woman whom I am meeting for the first time today. She is here with a [place] interpreter.  CT scan, [name] in past 12 months show no clear cause of her pains.  Intermittent pains, periumbilical.  Associated with nasuea and constipation.  Eating will cause bloating.  Overall weight has been up 4 pounds.  The pain can be present for hours, usually after eating.    Cannot sleep on her side because it makes the pain worse.  Takes tylenol for pains  Review of systems: Pertinent positive and negative review of systems were noted in the above HPI section. Complete review of systems was performed and was otherwise normal.    Past Medical History  Diagnosis Date  . GERD (gastroesophageal reflux disease)   . Low back pain     History reviewed. No pertinent past surgical history.  Current Outpatient Prescriptions  Medication Sig Dispense Refill  . cyclobenzaprine (FLEXERIL) 5 MG tablet Take 1 tablet (5 mg total) by mouth every 8 (eight) hours as needed for muscle spasms.  30 tablet  3  . fluconazole (DIFLUCAN) 100 MG tablet Take 200 mg by mouth once a week.      . Multiple Vitamins-Minerals (MULTIVITAMIN WITH MINERALS) tablet Take 1 tablet by mouth daily.       No current facility-administered medications for this visit.    Allergies as of 05/29/2013 - Review Complete 05/29/2013  Allergen Reaction Noted  . Ibuprofen Other (See Comments) 03/05/2011    Family History  Problem Relation Age of Onset  . Diabetes Mother   . Hypertension Mother   . Stomach cancer Mother   . Stroke Father     Died     History   Social History  . Marital Status: Single    Spouse Name: N/A    Number of Children: 2  . Years of Education: N/A   Occupational History  . Psychiatric nurse   . FOLDER    Social History Main Topics  . Smoking status: Never Smoker   . Smokeless tobacco: Not on file  . Alcohol Use: No  . Drug Use: No  . Sexual  Activity: Not on file   Other Topics Concern  . 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  She is standing and does not have to slouch or bend her back while working.  She has been working at [employer] for 4 years.       Physical Exam: BP 108/70  Pulse 66  Ht 5' 2.5" (1.588 m)  Wt 130 lb (58.968 kg)  BMI 23.38 kg/m2 Constitutional: generally well-appearing Psychiatric: alert and oriented x3 Eyes: extraocular movements intact Mouth: oral pharynx moist, no lesions Neck: supple no lymphadenopathy Cardiovascular: heart regular rate and rhythm Lungs: clear to auscultation bilaterally Abdomen: soft, nontender, nondistended, no obvious ascites, no peritoneal signs, normal bowel sounds Extremities: no lower extremity edema bilaterally Skin: no lesions on visible extremities    Assessment and plan: 37 y.o. female with  intermittent periumbilical pains that are worse after eating, family history of gastric cancer.  Unclear etiology. She has had ultrasound and CT scan the past 12 months without any obvious explanation. Those were done for this same type pain. Perhaps she is H. pylori, perhaps peptic ulcer disease. I would like to proceed with EGD at her soonest convenience. I see no reason for any further  blood tests or imaging studies prior to then.

## 2013-05-29 NOTE — Patient Instructions (Signed)
You will be set up for an upper endoscopy for your abd pains (LEC, moderate sedation).

## 2013-06-13 ENCOUNTER — Encounter: Payer: Self-pay | Admitting: Internal Medicine

## 2013-06-13 ENCOUNTER — Ambulatory Visit (INDEPENDENT_AMBULATORY_CARE_PROVIDER_SITE_OTHER): Payer: No Typology Code available for payment source | Admitting: Internal Medicine

## 2013-06-13 ENCOUNTER — Encounter: Payer: No Typology Code available for payment source | Admitting: Internal Medicine

## 2013-06-13 VITALS — BP 86/54 | HR 67 | Temp 97.0°F | Ht 63.0 in | Wt 129.3 lb

## 2013-06-13 DIAGNOSIS — R109 Unspecified abdominal pain: Secondary | ICD-10-CM

## 2013-06-13 DIAGNOSIS — M546 Pain in thoracic spine: Secondary | ICD-10-CM

## 2013-06-13 MED ORDER — PREDNISOLONE SODIUM PHOSPHATE 1 % OP SOLN: 1.0000 [drp] | Freq: Two times a day (BID) | OPHTHALMIC | Status: DC

## 2013-06-13 NOTE — Progress Notes (Signed)
Subjective:     Patient ID: Kelly Atkinson, female   DOB: 11/05/1975, 37 y.o.   MRN: [id number]  HPI The patient is a 37 Yo female who is spanish speaking. She is coming in today for a follow up and check up. She has PMH of back pain and some GERD. She is doing well today. She has noticed her eyes a little bit dry in the last few weeks and that her feet hurt after a long day of work. She is not having fevers, chills, weight loss, shortness of breath, chest pain. She is having the abdominal pain off and on but not right now. She will get EGD on December 9th. No complaints.    Review of Systems  Constitutional: Negative for fever, chills, diaphoresis, activity change, appetite change, fatigue and unexpected weight change.  Eyes: Negative for photophobia, pain, discharge, redness, itching and visual disturbance.       Eye dryness  Respiratory: Negative for cough, chest tightness, shortness of breath and wheezing.   Cardiovascular: Negative for chest pain, palpitations and leg swelling.  Gastrointestinal: Positive for abdominal pain. Negative for nausea, vomiting, diarrhea, constipation, blood in stool, abdominal distention, anal bleeding and rectal pain.       Off and on  Endocrine: Negative for cold intolerance, heat intolerance, polydipsia, polyphagia and polyuria.  Genitourinary: Negative.   Musculoskeletal: Negative for arthralgias, back pain, gait problem, joint swelling, myalgias, neck pain and neck stiffness.  Neurological: Negative for dizziness, tremors, seizures, syncope, facial asymmetry, speech difficulty, weakness, light-headedness, numbness and headaches.       Objective:   Physical Exam  Constitutional: She is oriented to person, place, and time. She appears well-developed and well-nourished. No distress.  HENT:  Head: Normocephalic and atraumatic.  Eyes: EOM are normal. Pupils are equal, round, and reactive to light.  Neck: Normal range of motion. Neck supple. No JVD  present. No tracheal deviation present. No thyromegaly present.  Cardiovascular: Normal rate and regular rhythm.   No murmur heard. Pulmonary/Chest: Effort normal and breath sounds normal. No respiratory distress. She has no wheezes. She has no rales. She exhibits no tenderness.  Abdominal: Soft. Bowel sounds are normal. She exhibits no distension. There is no tenderness. There is no rebound and no guarding.  Mild abdominal adiposity  Musculoskeletal: Normal range of motion. She exhibits no edema and no tenderness.  Lymphadenopathy:    She has no cervical adenopathy.  Neurological: She is alert and oriented to person, place, and time. No cranial nerve deficit.  Skin: Skin is warm and dry. No rash noted. She is not diaphoretic. No erythema. No pallor.       Assessment/Plan:   1. Please see problem oriented charting.   2. Disposition - The patient can be seen back in about 6 months with her PCP. Already got her flu shot this season.

## 2013-06-13 NOTE — Patient Instructions (Addendum)
Le daremos un colirio para Chemical engineer en tus ojos. Usted las puede [employer] veces al da si es necesario. Otra cosa que puede ayudar es conseguir un humidificador para su dormitorio para mantener ms Government social research officer.  Vuelve en unos 6 meses con su mdico o antes si tiene algn problema. Llmenos si necesita al [phone].  Esperemos que la endoscopia se enterar de lo que est pasando en su estmago.  Esofagogastroduodenoscopa  (Esophagogastroduodenoscopy) La esofagogastroduodenoscopia es un procedimiento para examinar el revestimiento del esfago, el estmago y la primera parte del intestino delgado (duodeno). Para ver estos rganos se inserta en la garganta un tubo largo, flexible y con iluminacin, conectado a una cmara (endoscopio). Este procedimiento se realiza para Psychologist, sport and exercise o anormalidades como inflamacin, sangrado, lceras o tumores, con el fin de tratarlos. El procedimiento dura aproximadamente entre 5 y 20 minutos. Por lo general, se trata de un procedimiento ambulatorio, pero es posible que sea necesario realizarlo en el hospital en casos de Associate Professor.  INFORME A SU MDICO ACERCA DE:   Alergias a alimentos o medicamentos.  Todos los [employer] [place], incluyendo vitaminas, hierbas, gotas oftlmicas, medicamentos de [place] y Control and instrumentation engineer.  Uso de corticoides (por va oral o cremas).  Problemas previos que usted o los [employer] de su familia hayan tenido con el uso de anestsicos.  Enfermedades de [employer].  Cirugas previas.  Otros problemas de [place].  Posibilidad de embarazo, si corresponde. RIESGOS Y COMPLICACIONES  En general es un procedimiento seguro. Sin embargo, Tree surgeon procedimiento, pueden surgir complicaciones. Las complicaciones posibles son:   Infecciones.  Sangrado.  Ruptura (perforacin) del esfago, el estmago o del duodeno.  Dificultad para respirar o no poder respirar.  Sudoracin excesiva.  Espasmos de  laringe  Disminucin de los latidos cardacos.  Presin arterial baja. ANTES DEL PROCEDIMIENTO   No coma ni beba desde las 8 horas anteriores al procedimiento, o segn le indique el mdico.  Consulte a su mdico si debe cambiar o suspender los medicamentos que toma habitualmente.  Si [place] dientes postizos, preprese para quitrselos antes del procedimiento.  Pdale a alguna persona que la lleve a su casa luego del procedimiento. PROCEDIMIENTO   Le administrarn medicamentos y lquidos a travs de una vena. Le darn un medicamento para relajarlo (sedante) y un analgsico a travs del acceso a la vena.  Le rociarn la garganta con un medicamento para adormecer (anestsico local ) para Dietitian, nuseas y la tos.  Le colocarn un protector bucal para proteger los dientes y para evitar que muerda el endoscopio.  Le pedirn que se recueste sobre el costado izquierdo.  El endoscopio se introduce por la Artist al esfago, el estmago y [place].  Se introduce aire a travs del endoscopio para permitirle al mdico ver el revestimiento del esfago.  Se examina el esfago, el estmago y [place]. Durante el examen, el mdico:  Podr retirar Lauris Poag de tejido para examinarla en el microscopio (biopsia) para diagnosticar inflamacin infecciones u otros problemas mdicos.  Extirpar tumores.  Retirar objetos (cuerpos extraos) que estn atascados.  Tratar el sangrado con medicamentos u otros dispositivos que impiden que los tejidos sangren (cauterizador con Airline pilot, instrumentos de pinzamiento).  Ensanchar (dilatar) o estirar reas estrechas del esfago y Investment banker, corporate.  Luego se retirar el endoscopio. DESPUS DEL PROCEDIMIENTO   Lo trasladarn una sala de recuperacin para hacerle controles. Podr volver a su casa una vez que se encuentre estable y Control and instrumentation engineer.  No coma ni beba nada hasta que el anestsico local haya desaparecido. Podra ahogarse.  Es normal  que se sienta hinchado, sienta dolor al tragar o IT sales professional de garganta por un corto tiempo. Esto desaparecer.  El mdico comentar los hallazgos con usted. Demorar ms [employer] de las pruebas si se tomaron biopsias. Document Released: 10/30/2012 [facility] Patient Information 2014 [place], [place].    We will give you an eye drop to use in your eyes. Another thing that might help is to get a humidifier for your bedroom to keep more moisture in the air.   Come back in about 6 months with your doctor or sooner if you have any problems. Call us if you need to at [phone].  Hopefully the endoscopy will find out what is going on in your stomach.

## 2013-06-13 NOTE — Assessment & Plan Note (Signed)
Thought to be related to GERD with history of normal CT abdomen and US abdomen and normal LFTs and BMP. She is getting EGD on December 9th to exclude ulcer and take biopsies for H. Pylori.

## 2013-06-13 NOTE — Progress Notes (Signed)
Case discussed with Dr. Kollar at the time of the visit.  We reviewed the resident's history and exam and pertinent patient test results.  I agree with the assessment, diagnosis, and plan of care documented in the resident's note.     

## 2013-06-13 NOTE — Assessment & Plan Note (Signed)
Has been much better lately and she still has adequate supply of flexeril at home which she uses sparingly.

## 2013-06-26 ENCOUNTER — Encounter: Payer: Self-pay | Admitting: Gastroenterology

## 2013-06-26 ENCOUNTER — Ambulatory Visit (AMBULATORY_SURGERY_CENTER): Payer: No Typology Code available for payment source | Admitting: Gastroenterology

## 2013-06-26 VITALS — BP 106/56 | HR 78 | Temp 97.7°F | Resp 16 | Ht 62.5 in | Wt 130.0 lb

## 2013-06-26 DIAGNOSIS — K2970 Gastritis, unspecified, without bleeding: Secondary | ICD-10-CM

## 2013-06-26 DIAGNOSIS — D131 Benign neoplasm of stomach: Secondary | ICD-10-CM

## 2013-06-26 DIAGNOSIS — R109 Unspecified abdominal pain: Secondary | ICD-10-CM

## 2013-06-26 DIAGNOSIS — K2960 Other gastritis without bleeding: Secondary | ICD-10-CM

## 2013-06-26 MED ORDER — SODIUM CHLORIDE 0.9 % IV SOLN: 500.0000 mL | INTRAVENOUS | Status: DC

## 2013-06-26 NOTE — Op Note (Signed)
[facility] [address] [place], [address]   ENDOSCOPY PROCEDURE REPORT  PATIENT: Kelly, Atkinson  MR#: [id number] BIRTHDATE: [birth year] , 37  yrs. old GENDER: Female ENDOSCOPIST: Rachael Fee, MD PROCEDURE DATE:  06/26/2013 PROCEDURE:  EGD w/ biopsy ASA CLASS:     Class II INDICATIONS:  Dyspepsia. MEDICATIONS: Fentanyl 50 mcg IV, Versed 5 mg IV, and These medications were titrated to patient response per physician's verbal order TOPICAL ANESTHETIC: Cetacaine Spray  DESCRIPTION OF PROCEDURE: After the risks benefits and alternatives of the procedure were thoroughly explained, informed consent was obtained.  The LB [id number] endoscope was introduced through the mouth and advanced to the second portion of the duodenum. Without limitations.  The instrument was slowly withdrawn as the mucosa was fully examined.    There was mild, non-specific distal gastritis.  Biopsies were taken and sent to pathology.  The examination was otherwise normal. Retroflexed views revealed no abnormalities.     The scope was then withdrawn from the patient and the procedure completed. COMPLICATIONS: There were no complications.  ENDOSCOPIC IMPRESSION: There was mild, non-specific distal gastritis.  Biopsies were taken and sent to pathology.  The examination was otherwise normal.  RECOMMENDATIONS: If biopsies show H.  pylori, you will be started on appropriate antibiotics.    eSigned:  Rachael Fee, MD 06/26/2013 3:18 PM

## 2013-06-26 NOTE — Patient Instructions (Signed)
YOU HAD AN ENDOSCOPIC PROCEDURE TODAY AT THE [facility]: Refer to the procedure report that was given to you for any specific questions about what was found during the examination.  If the procedure report does not answer your questions, please call your gastroenterologist to clarify.  If you requested that your care partner not be given the details of your procedure findings, then the procedure report has been included in a sealed envelope for you to review at your convenience later.  YOU SHOULD EXPECT: Some feelings of bloating in the abdomen. Passage of more gas than usual.  Walking can help get rid of the air that was put into your GI tract during the procedure and reduce the bloating. If you had a lower endoscopy (such as a colonoscopy or flexible sigmoidoscopy) you may notice spotting of blood in your stool or on the toilet paper. If you underwent a bowel prep for your procedure, then you may not have a normal bowel movement for a few days.  DIET: Your first meal following the procedure should be a light meal and then it is ok to progress to your normal diet.  A half-sandwich or bowl of soup is an example of a good first meal.  Heavy or fried foods are harder to digest and may make you feel nauseous or bloated.  Likewise meals heavy in dairy and vegetables can cause extra gas to form and this can also increase the bloating.  Drink plenty of fluids but you should avoid alcoholic beverages for 24 hours.  ACTIVITY: Your care partner should take you home directly after the procedure.  You should plan to take it easy, moving slowly for the rest of the day.  You can resume normal activity the day after the procedure however you should NOT DRIVE or use heavy machinery for 24 hours (because of the sedation medicines used during the test).    SYMPTOMS TO REPORT IMMEDIATELY: A gastroenterologist can be reached at any hour.  During normal business hours, 8:30 AM to 5:00 PM Monday through Friday,  call [phone].  After hours and on weekends, please call the GI answering service at [phone] who will take a message and have the physician on call contact you.   Following upper endoscopy (EGD)  Vomiting of blood or coffee ground material  New chest pain or pain under the shoulder blades  Painful or persistently difficult swallowing  New shortness of breath  Fever of 100F or higher  Black, tarry-looking stools  FOLLOW UP: If any biopsies were taken you will be contacted by phone or by letter within the next 1-3 weeks.  Call your gastroenterologist if you have not heard about the biopsies in 3 weeks.  Our staff will call the home number listed on your records the next business day following your procedure to check on you and address any questions or concerns that you may have at that time regarding the information given to you following your procedure. This is a courtesy call and so if there is no answer at the home number and we have not heard from you through the emergency physician on call, we will assume that you have returned to your regular daily activities without incident.  SIGNATURES/CONFIDENTIALITY: You and/or your care partner have signed paperwork which will be entered into your electronic medical record.  These signatures attest to the fact that that the information above on your After Visit Summary has been reviewed and is understood.  Full responsibility   of the confidentiality of this discharge information lies with you and/or your care-partner.  Recommendations See procedure report 

## 2013-06-26 NOTE — Progress Notes (Signed)
Patient did not have preoperative order for IV antibiotic SSI prophylaxis. (G8918)  Patient did not experience any of the following events: a burn prior to discharge; a fall within the facility; wrong site/side/patient/procedure/implant event; or a hospital transfer or hospital admission upon discharge from the facility. (G8907)  

## 2013-06-27 ENCOUNTER — Telehealth: Payer: Self-pay

## 2013-06-27 NOTE — Telephone Encounter (Signed)
  Follow up Call-  Call back number 06/26/2013  Post procedure Call Back phone  # [phone]  Permission to leave phone message Yes     Patient questions:  Do you have a fever, pain , or abdominal swelling? no Pain Score  0 *  Have you tolerated food without any problems? yes  Have you been able to return to your normal activities? yes  Do you have any questions about your discharge instructions: Diet   no Medications  no Follow up visit  no  Do you have questions or concerns about your Care? no  Actions: * If pain score is 4 or above: No action needed, pain <4.

## 2013-07-04 ENCOUNTER — Encounter: Payer: Self-pay | Admitting: Gastroenterology

## 2013-09-10 ENCOUNTER — Ambulatory Visit: Payer: No Typology Code available for payment source

## 2013-09-28 ENCOUNTER — Encounter: Payer: Self-pay | Admitting: Internal Medicine

## 2013-09-28 ENCOUNTER — Ambulatory Visit (INDEPENDENT_AMBULATORY_CARE_PROVIDER_SITE_OTHER): Payer: No Typology Code available for payment source | Admitting: Internal Medicine

## 2013-09-28 VITALS — BP 103/67 | HR 82 | Temp 98.5°F | Ht 63.0 in | Wt 130.9 lb

## 2013-09-28 DIAGNOSIS — O09529 Supervision of elderly multigravida, unspecified trimester: Secondary | ICD-10-CM

## 2013-09-28 DIAGNOSIS — R1032 Left lower quadrant pain: Secondary | ICD-10-CM

## 2013-09-28 DIAGNOSIS — R109 Unspecified abdominal pain: Secondary | ICD-10-CM

## 2013-09-28 LAB — POCT URINALYSIS DIPSTICK
Bilirubin, UA: NEGATIVE
GLUCOSE UA: NEGATIVE
KETONES UA: NEGATIVE
LEUKOCYTES UA: NEGATIVE
Nitrite, UA: NEGATIVE
PROTEIN UA: NEGATIVE
Spec Grav, UA: 1.01
UROBILINOGEN UA: 0.2
pH, UA: 7.5

## 2013-09-28 LAB — POCT URINE PREGNANCY: PREG TEST UR: POSITIVE

## 2013-09-28 NOTE — Patient Instructions (Signed)
We will refer you to the Obstetrician for manage your pregnancy. You may continue to take your vitamins for now.  Please bring your medicines with you each time you come.   Medicines may be:  Eye drops  Herbal   Vitamins  Pills  Seeing these help [name] take care of you.   Le referiremos a la obstetra para Interior and spatial designer. Usted puede seguir tomando sus vitaminas por ahora .  Favor de traer sus medicamentos con usted cada vez que venga .  Los medicamentos pueden ser: gotas para los ojos herbario Vitaminas [place]

## 2013-09-28 NOTE — Assessment & Plan Note (Signed)
Postive urine pregn test -will check beta hcg and refer to OB-GYN for further management

## 2013-09-28 NOTE — Assessment & Plan Note (Addendum)
Positive Urine preg, will get blood hcg Would like to complete the pregnancy if baby is 'normal'. Discussed at length via interpretor concerning pregnancy at advanced age and need for high-risk OB consultation. -d/c flexeril -cont multi-vit in anticipation that they will be changed to Prenatal vitamins by OB

## 2013-09-28 NOTE — Progress Notes (Signed)
   Subjective:    Patient ID: Kelly Atkinson, female    DOB: May 08, 1976, 38 y.o.   MRN: [id number]  HPI  Spanish speaking patient who presents with interpretor with complaint of abdominal pain.  States that it has been ongoing for past 2 days.  States that it feels like her previous abdominal pains just in a different side.  Her hx is significant for frequent abdominal pain with unclear etiology including negative work-up dating back to 2011 and as recent as December 2014.(nl abdomina U/S, normal CT of abdomen, normal vaginal U/S) States that she is late with her menses this month and is usually very regular.   Review of Systems  Constitutional: Negative for fever.  HENT: Negative.   Eyes: Negative.   Respiratory: Negative.   Gastrointestinal: Negative for diarrhea and constipation.  Genitourinary: Positive for menstrual problem and pelvic pain. Negative for dysuria, urgency, hematuria, vaginal bleeding and vaginal discharge.       Has not had period this month and was due 4 days ago.  Neurological: Negative.   Psychiatric/Behavioral: Negative.        Objective:   Physical Exam  Constitutional: She is oriented to person, place, and time. She appears well-developed and well-nourished. No distress.  Spanish interpretor present  HENT:  Head: Normocephalic and atraumatic.  Eyes: Conjunctivae and EOM are normal. Pupils are equal, round, and reactive to light.  Neck: Normal range of motion.  Cardiovascular: Normal rate, regular rhythm and normal heart sounds.   Pulmonary/Chest: Effort normal and breath sounds normal.  Abdominal: Soft. Bowel sounds are normal. She exhibits no distension and no mass. There is tenderness. There is no rebound and no guarding.  Musculoskeletal: Normal range of motion.  Neurological: She is alert and oriented to person, place, and time.  Skin: Skin is warm and dry.  Psychiatric: She has a normal mood and affect.          Assessment & Plan:  See  separate problem list charting:

## 2013-09-29 LAB — HCG, SERUM, QUALITATIVE: PREG SERUM: POSITIVE

## 2013-10-04 NOTE — Progress Notes (Signed)
Case discussed with Dr. Schooler at the time of the visit.  We reviewed the resident's history and exam and pertinent patient test results.  I agree with the assessment, diagnosis, and plan of care documented in the resident's note.     

## 2013-10-18 ENCOUNTER — Encounter: Payer: Self-pay | Admitting: Internal Medicine

## 2013-10-18 ENCOUNTER — Ambulatory Visit (INDEPENDENT_AMBULATORY_CARE_PROVIDER_SITE_OTHER): Payer: No Typology Code available for payment source | Admitting: Internal Medicine

## 2013-10-18 VITALS — BP 90/53 | HR 82 | Temp 99.4°F | Wt 138.2 lb

## 2013-10-18 DIAGNOSIS — H109 Unspecified conjunctivitis: Secondary | ICD-10-CM

## 2013-10-18 MED ORDER — POLYMYXIN B-TRIMETHOPRIM 10000-0.1 UNIT/ML-% OP SOLN: 1.0000 [drp] | OPHTHALMIC | Status: DC

## 2013-10-18 NOTE — Patient Instructions (Addendum)
I have diagnosed you with acute conjunctivitis and a stye.  Please use the eye drops as directed.  Please use warm compresses three times daily. Please clean your eye lashes with water and baby shampoo.  Please contact doctor for new or worsening symptoms.

## 2013-10-18 NOTE — Progress Notes (Signed)
Patient ID: Kelly Atkinson, female   DOB: [birth year], 38 y.o.   MRN: [id number]    Subjective:   Patient ID: Kelly Atkinson female   DOB: 16-Jun-1976 38 y.o.   MRN: [id number]  HPI: [name] is a 38 y.o. woman who presents with a cc of right eye discomfort. The patient was in her normal state of health until two weeks ago she developed red eye, and discomfort (in the right eye). She denies blurry vision. Since then, the eye has begun to produce greenish discharge. In the morning her eye feels stuck closed. She denies sick contacts. The problem has not effected her left eye. She denies contact lens use. She has not tried anything for this problem.   Denis fevers, chills, N, V. Denies pain with eye movement.     Past Medical History  Diagnosis Date  . GERD (gastroesophageal reflux disease)   . Low back pain    Current Outpatient Prescriptions  Medication Sig Dispense Refill  . Multiple Vitamins-Minerals (MULTIVITAMIN WITH MINERALS) tablet Take 1 tablet by mouth daily.      Marland Kitchen trimethoprim-polymyxin b (POLYTRIM) ophthalmic solution Place 1 drop into the right eye every 4 (four) hours. For 7 days.  10 mL  0   No current facility-administered medications for this visit.   Family History  Problem Relation Age of Onset  . Diabetes Mother   . Hypertension Mother   . Stomach cancer Mother   . Stroke Father     Died    History   Social History  . Marital Status: Single    Spouse Name: N/A    Number of Children: 2  . Years of Education: N/A   Occupational History  . Oncologist   . FOLDER    Social History Main Topics  . Smoking status: Never Smoker   . Smokeless tobacco: None  . Alcohol Use: No  . Drug Use: No  . Sexual Activity: None   Other Topics Concern  . 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  She is standing and does not have to slouch or bend her back while working.  She has been  working at [employer] for 4 years.   Review of Systems: Pertinent items are noted in HPI. Objective:  Physical Exam: Filed Vitals:   10/18/13 1436  BP: 90/53  Pulse: 82  Temp: 99.4 F (37.4 C)  TempSrc: Oral  Weight: 138 lb 3.2 oz (62.687 kg)  SpO2: 98%   Physical Exam  Constitutional: She appears well-developed and well-nourished. No distress.  HENT:  Head: Normocephalic and atraumatic.  Mouth/Throat: Oropharynx is clear and moist.  Eyes: EOM are normal. Pupils are equal, round, and reactive to light. Right eye exhibits discharge and exudate. Right eye exhibits no chemosis and no hordeolum. No foreign body present in the right eye. Left eye exhibits no chemosis, no discharge, no exudate and no hordeolum. No foreign body present in the left eye. Right conjunctiva is injected. Right conjunctiva has no hemorrhage. Left conjunctiva is not injected. Left conjunctiva has no hemorrhage. No scleral icterus.    Mild conjunctivitis of the right eye. Mild to moderate amount of exudate in the right eye. Mild swelling of the right lower lid isolated to 4 mm area with corresponding erythema.  Skin: She is not diaphoretic.    Assessment & Plan:

## 2013-10-22 NOTE — Assessment & Plan Note (Signed)
The patients symptoms are likely due to bacterial conjunctivitis. There does not appear corneal involvement by physical exam. Further, VA remains 20/20 bilaterally. Plan to treat with empiric first line ABX drops for 1 week. Instructed patient to contact MD for new or worsening symptoms. The patient agreed with the plan.

## 2013-10-22 NOTE — Progress Notes (Signed)
Case discussed with Dr. Komanski at time of visit.  We reviewed the resident's history and exam and pertinent patient test results.  I agree with the assessment, diagnosis, and plan of care documented in the resident's note. 

## 2013-11-17 ENCOUNTER — Encounter (HOSPITAL_COMMUNITY): Payer: Self-pay

## 2013-11-17 ENCOUNTER — Inpatient Hospital Stay (HOSPITAL_COMMUNITY)
Admission: AD | Admit: 2013-11-17 | Discharge: 2013-11-17 | Disposition: A | Discharge status: Home / Self Care | Payer: No Typology Code available for payment source | Source: Ambulatory Visit | Attending: Obstetrics & Gynecology | Admitting: Obstetrics & Gynecology

## 2013-11-17 ENCOUNTER — Inpatient Hospital Stay (HOSPITAL_COMMUNITY): Payer: No Typology Code available for payment source

## 2013-11-17 DIAGNOSIS — R109 Unspecified abdominal pain: Secondary | ICD-10-CM | POA: Insufficient documentation

## 2013-11-17 DIAGNOSIS — Z833 Family history of diabetes mellitus: Secondary | ICD-10-CM | POA: Insufficient documentation

## 2013-11-17 DIAGNOSIS — O200 Threatened abortion: Secondary | ICD-10-CM | POA: Insufficient documentation

## 2013-11-17 DIAGNOSIS — K219 Gastro-esophageal reflux disease without esophagitis: Secondary | ICD-10-CM | POA: Insufficient documentation

## 2013-11-17 DIAGNOSIS — O364XX0 Maternal care for intrauterine death, not applicable or unspecified: Secondary | ICD-10-CM

## 2013-11-17 DIAGNOSIS — IMO0002 Reserved for concepts with insufficient information to code with codable children: Secondary | ICD-10-CM

## 2013-11-17 LAB — URINALYSIS, ROUTINE W REFLEX MICROSCOPIC
BILIRUBIN URINE: NEGATIVE
Glucose, UA: NEGATIVE mg/dL
Ketones, ur: NEGATIVE mg/dL
Leukocytes, UA: NEGATIVE
Nitrite: NEGATIVE
PROTEIN: NEGATIVE mg/dL
Specific Gravity, Urine: 1.01 (ref 1.005–1.030)
UROBILINOGEN UA: 0.2 mg/dL (ref 0.0–1.0)
pH: 6 (ref 5.0–8.0)

## 2013-11-17 LAB — URINE MICROSCOPIC-ADD ON

## 2013-11-17 NOTE — Discharge Instructions (Signed)
Expect the Clinic to call you with an appointment.  Call them if you have not gotten a call by Tuesday.

## 2013-11-17 NOTE — MAU Note (Signed)
Unable to doppler FHT 

## 2013-11-17 NOTE — MAU Note (Signed)
Pt presents complaining of lower abdominal pain that comes and goes and spotting that started yesterday. Denies other vaginal discharge.

## 2013-11-17 NOTE — MAU Provider Note (Signed)
History     CSN: [id number]  Arrival date and time: 11/17/13 1138   First Provider Initiated Contact with Patient 11/17/13 1227      Chief Complaint  Patient presents with  . Abdominal Pain  . Vaginal Bleeding   HPI Kelly Atkinson 38 y.o. [redacted]w[redacted]d  Comes to MAU with vaginal bleeding - has had bleeding before but this is more.  Also is having lower abdominal cramping.  [facility] to hear the [facility].  Has an appointment on Monday for another visit.  But is now having bleeding and cramping and is worried about a miscarriage.  Unable to hear FHT with doppler.    OB History   Grav Para Term Preterm Abortions TAB SAB Ect Mult Living   3 2 2       2       Past Medical History  Diagnosis Date  . GERD (gastroesophageal reflux disease)   . Low back pain     Past Surgical History  Procedure Laterality Date  . Wisdom tooth extraction      Family History  Problem Relation Age of Onset  . Diabetes Mother   . Hypertension Mother   . Stomach cancer Mother   . Stroke Father     Died     History  Substance Use Topics  . Smoking status: Never Smoker   . Smokeless tobacco: Not on file  . Alcohol Use: No    Allergies:  Allergies  Allergen Reactions  . Ibuprofen Anaphylaxis    Prescriptions prior to admission  Medication Sig Dispense Refill  . acetaminophen (TYLENOL) 500 MG tablet Take 1,000 mg by mouth every 6 (six) hours as needed for mild pain or headache.      . Prenatal Vit-Fe Fumarate-FA (PRENATAL MULTIVITAMIN) TABS tablet Take 1 tablet by mouth daily at 12 noon.        Review of Systems  Constitutional: Negative for fever.  Gastrointestinal: Positive for abdominal pain. Negative for nausea and vomiting.  Genitourinary:       No vaginal discharge. Vaginal bleeding. No dysuria.   Physical Exam   Blood pressure 110/67, pulse 70, temperature 98.3 F (36.8 C), temperature source Oral, resp. rate  18, last menstrual period 08/26/2013.  Physical Exam  Nursing note and vitals reviewed. Constitutional: She is oriented to person, place, and time. She appears well-developed and well-nourished.  HENT:  Head: Normocephalic.  Eyes: EOM are normal.  Neck: Neck supple.  GI: Soft. There is tenderness. There is no rebound and no guarding.  Fundus palpated at symphysis.  FHT not heard with doppler.  Genitourinary:  Speculum exam - small amount of dark blood in vagina.  Cervix closed on bimanual.  Uterus 10-11 week size and firm.  Musculoskeletal: Normal range of motion.  Neurological: She is alert and oriented to person, place, and time.  Skin: Skin is warm and dry.  Psychiatric: She has a normal mood and affect.    MAU Course  Procedures  MDM CLINICAL DATA: Pregnant. Spotting.  EXAM:  OBSTETRIC <14 WK ULTRASOUND  TECHNIQUE:  Transabdominal ultrasound was performed for evaluation of the  gestation as well as the maternal uterus and adnexal regions.  COMPARISON: None.  FINDINGS:  Intrauterine gestational sac: Visualized/normal in shape.  Yolk sac: None  Embryo: Yes  Cardiac Activity: Narrow  Heart Rate: No fetal heart tones detectable  CRL: 16.4 mm 8 w 1 d [name] EDC:  Maternal uterus/adnexae: No uterine mass. No subchorionic  hemorrhage. Both ovaries are well seen and are unremarkable. No  adnexal masses. No free fluid.  IMPRESSION:  1. No fetal heart activity detectable. Findings consistent with  intrauterine fetal demise.    Assessment and Plan  IUFD at 8 week Beginning miscarriage   Plan: Discussed options for care.  Client choosing expectant management.  Instructed to call MAU if decides she wants Cytotec.  Does not want Cytotec today. Will need an appointment for follow up in the GYN clinic in 2 weeks. Reviewed expected course with increased pain and heavy bleeding in a miscarriage.  Kelly Atkinson 11/17/2013, 12:27 PM

## 2013-12-03 ENCOUNTER — Encounter: Payer: Self-pay | Admitting: Obstetrics & Gynecology

## 2013-12-03 ENCOUNTER — Encounter: Payer: Self-pay | Admitting: Internal Medicine

## 2013-12-03 ENCOUNTER — Ambulatory Visit (INDEPENDENT_AMBULATORY_CARE_PROVIDER_SITE_OTHER): Payer: No Typology Code available for payment source | Admitting: Obstetrics & Gynecology

## 2013-12-03 VITALS — BP 95/66 | HR 61 | Temp 97.1°F | Ht 61.25 in | Wt 134.1 lb

## 2013-12-03 DIAGNOSIS — O039 Complete or unspecified spontaneous abortion without complication: Secondary | ICD-10-CM

## 2013-12-03 NOTE — Progress Notes (Signed)
Subjective:     Patient ID: Kelly Atkinson, female   DOB: [birth year], 38 y.o.   MRN: [id number]  HPI Pt is a [id number] with h/o SAB 11/17/2013.  She reports that she was seen at the [facility] and dx'd with a missed AB.  She went home ans passed the tissue later that night.  She reports that she passed a large clots and then had spotting following.  She reports oc cramping but, denies problems since that time.  She does report that she has a new FOB and had questions about why she had the miscarriage.    Review of Systems     Objective:   Physical Exam BP 95/66  Pulse 61  Temp(Src) 97.1 F (36.2 C) (Oral)  Ht 5' 1.25" (1.556 m)  Wt 134 lb 1.6 oz (60.827 kg)  BMI 25.12 kg/m2  LMP 08/26/2013  Pt in NAD Exam deferred    11/17/2013 EXAM:  OBSTETRIC <14 WK ULTRASOUND  TECHNIQUE:  Transabdominal ultrasound was performed for evaluation of the  gestation as well as the maternal uterus and adnexal regions.  COMPARISON: None.  FINDINGS:  Intrauterine gestational sac: Visualized/normal in shape.  Yolk sac: None  Embryo: Yes  Cardiac Activity: Narrow  Heart Rate: No fetal heart tones detectable  CRL: 16.4 mm 8 w 1 d [name] EDC:  Maternal uterus/adnexae: No uterine mass. No subchorionic  hemorrhage. Both ovaries are well seen and are unremarkable. No  adnexal masses. No free fluid.  IMPRESSION:  1. No fetal heart activity detectable. Findings consistent with  intrauterine fetal demise.      Assessment:     S/p SAB doing well  Pt declines contraception as she wants to try to get pregnant soon    Plan:     Reviewed causes of  SAB rec 3 months prior to attempting pregnancy F/u prn Pt to continue taking PNV's

## 2013-12-03 NOTE — Progress Notes (Signed)
Patient reports some light bleeding.

## 2013-12-03 NOTE — Patient Instructions (Signed)
Aborto espontneo  (Miscarriage) El aborto espontneo es la prdida de un beb que no ha nacido (feto) antes de la semana 20 del Media planner. La mayor parte de estos abortos ocurre en los primeros 3 meses. En algunos casos ocurre antes de que la mujer sepa que est [place]. Tambin se denomina "aborto espontneo" o "prdida prematura del embarazo". El aborto espontneo puede ser Ardelia Mems experiencia que afecte emocionalmente a Geologist, engineering. Converse con su mdico si tiene dudas, cmo es el proceso de [place], y sobre planes futuros de Media planner.  CAUSAS   Algunos problemas cromosmicos pueden hacer imposible que el beb se desarrolle normalmente. Los problemas con los genes o cromosomas del beb son generalmente el resultado de errores que se producen, por casualidad, cuando el embrin se divide y crece. Estos problemas no se heredan de los [place].  Infeccin en el cuello del tero.   Problemas hormonales.   Problemas en el cuello del tero, como tener un tero incompetente. Esto ocurre cuando los tejidos no son lo suficientemente fuertes como para Risk manager.   Problemas del tero, como un tero con forma anormal, los fibromas o anormalidades congnitas.   Ciertas enfermedades crnicas.   No fume, no beba alcohol, ni consuma drogas.   Traumatismos  A veces, la causa es desconocida.  SNTOMAS   Sangrado o manchado vaginal, con o sin clicos o dolor.  Dolor o clicos en el abdomen o en la cintura.  Eliminacin de lquido, tejidos o cogulos grandes por la vagina. DIAGNSTICO  El [employer] har un examen fsico. Tambin le indicar una ecografa para confirmar el aborto. Es posible que se realicen anlisis de [place].  TRATAMIENTO   En algunos casos el tratamiento no es necesario, si se eliminan naturalmente todos los tejidos embrionarios que se encontraban en el tero. Si el feto o la placenta quedan dentro del tero (aborto incompleto), pueden infectarse, los tejidos que quedan  pueden infectarse y deben retirarse. Generalmente se realiza un procedimiento de dilatacin y curetaje (D y C). Durante el procedimiento de dilatacin y curetaje, el cuello del tero se abre (dilata) y se retira cualquier resto de tejido fetal o placentario del tero.  Si hay una infeccin, le recetarn antibiticos. Podrn recetarle otros medicamentos para reducir el tamao del tero (contraerlo) si hay una mucho sangrado.  Si su sangre es Rh negativa y su beb es Rh positivo, usted necesitar la inyeccin de inmunoglobulina Rh. Esta inyeccin proteger a los futuros bebs de tener problemas de compatibilidad Rh en futuros embarazos. INSTRUCCIONES PARA EL CUIDADO EN EL HOGAR   El mdico le indicar reposo en cama o le permitir Automotive engineer. Vuelva a la actividad lentamente o segn las indicaciones de su mdico.  Pdale a alguien que la ayude con las responsabilidades familiares y del hogar durante este tiempo.   Lleve un registro de la cantidad y la saturacin de las toallas higinicas que Medical laboratory scientific officer. Anote esta informacin   No use tampones. No No se haga duchas vaginales ni tenga relaciones sexuales hasta que el mdico la autorice.   Slo tome medicamentos de venta libre o recetados para Glass blower/designer o Health and safety inspector, segn las indicaciones de su mdico.   No tome aspirina. La aspirina puede ocasionar hemorragias.   Concurra puntualmente a las citas de control con el mdico.   Si usted o su pareja tienen dificultades con el duelo, hable con su mdico para buscar la ayuda psicolgica que los ayude a enfrentar la prdida  del embarazo. Permítase el tiempo suficiente de duelo antes de quedar embarazada nuevamente.   °SOLICITE ATENCIÓN MÉDICA DE INMEDIATO SI:  °· Siente calambres intensos o dolor en la espalda o en el abdomen. °· Tiene fiebre. °· Elimina grandes coágulos de sangre (del tamaño de una nuez o más) o tejidos por la vagina. Guarde lo que ha eliminado para  que su médico lo examine.   °· La hemorragia aumenta.   °· Observa una secreción vaginal espesa y con mal olor. °· Se siente mareada, débil, o se desmaya.   °· Siente escalofríos.   °ASEGÚRESE DE QUE:  °· Comprende estas instrucciones. °· Controlará su enfermedad. °· Solicitará ayuda de inmediato si no mejora o si empeora. °Document Released: 04/14/2005 Document Revised: 10/30/2012 °ExitCare® Patient Information ©2014 ExitCare, LLC. ° °

## 2013-12-11 ENCOUNTER — Encounter: Payer: Self-pay | Admitting: Obstetrics & Gynecology

## 2014-02-25 ENCOUNTER — Ambulatory Visit (INDEPENDENT_AMBULATORY_CARE_PROVIDER_SITE_OTHER): Payer: No Typology Code available for payment source | Admitting: Internal Medicine

## 2014-02-25 ENCOUNTER — Encounter: Payer: Self-pay | Admitting: Internal Medicine

## 2014-02-25 VITALS — BP 82/56 | HR 58 | Temp 98.4°F | Ht 60.0 in | Wt 130.8 lb

## 2014-02-25 DIAGNOSIS — H109 Unspecified conjunctivitis: Secondary | ICD-10-CM

## 2014-02-25 DIAGNOSIS — R42 Dizziness and giddiness: Secondary | ICD-10-CM

## 2014-02-25 LAB — CBC WITH DIFFERENTIAL/PLATELET
Basophils Absolute: 0 10*3/uL (ref 0.0–0.1)
Basophils Relative: 0 % (ref 0–1)
Eosinophils Absolute: 0 10*3/uL (ref 0.0–0.7)
Eosinophils Relative: 1 % (ref 0–5)
HCT: 38.1 % (ref 36.0–46.0)
HEMOGLOBIN: 13 g/dL (ref 12.0–15.0)
Lymphocytes Relative: 32 % (ref 12–46)
Lymphs Abs: 1.5 10*3/uL (ref 0.7–4.0)
MCH: 31.1 pg (ref 26.0–34.0)
MCHC: 34.1 g/dL (ref 30.0–36.0)
MCV: 91.1 fL (ref 78.0–100.0)
MONOS PCT: 8 % (ref 3–12)
Monocytes Absolute: 0.4 10*3/uL (ref 0.1–1.0)
NEUTROS ABS: 2.7 10*3/uL (ref 1.7–7.7)
NEUTROS PCT: 59 % (ref 43–77)
Platelets: 164 10*3/uL (ref 150–400)
RBC: 4.18 MIL/uL (ref 3.87–5.11)
RDW: 14.3 % (ref 11.5–15.5)
WBC: 4.6 10*3/uL (ref 4.0–10.5)

## 2014-02-25 LAB — POCT URINE PREGNANCY: PREG TEST UR: NEGATIVE

## 2014-02-25 NOTE — Patient Instructions (Addendum)
Thank you for your visit today.  Please try to stay hydrated at work by eating and drinking.   Return to the clinic if your symptoms are worsening or fail to improve.    I will check a pregnancy test today and your blood count today.  Please be careful from a sitting to a standing position and sit back down if you feel faint.   Please be sure to bring all of your medications with you to every visit; this includes herbal supplements, vitamins, eye drops, and any over-the-counter medications.   Should you have any questions regarding your medications and/or any new or worsening symptoms, please be sure to call the clinic at [phone].   If you believe that you are suffering from a life threatening condition or one that may result in the loss of limb or function, then you should call 911 or proceed to the nearest Emergency Department.    Mareos (Dizziness) Los mareos son un problema muy frecuente. Se trata de una sensacin de inestabilidad o aturdimiento. Puede sentir que se va a desvanecer. Puede provocarle una lesin si se tropieza o se cae. Las Engineer, manufacturing de cualquier edad pueden sufrir mareos, pero es ms frecuente [employer] ancianos. CAUSAS  La causa puede deberse a muchos problemas diferentes:  Problemas en el odo medio.  Estar de pie [employer].  Infecciones.  Reacciones alrgicas.  Envejecimiento.  Respuesta emocional a distintas cosas, como por ejemplo la visin de sangre.  Efectos secundarios de Ecologist.  Cansancio.  Problemas circulatorios o de presin arterial.  Consumo excesivo de alcohol, medicamentos o drogas.  Respiracin muy rpida (hiperventilacin).  Ritmo cardaco irregular (arritmia).  Bajo recuento de glbulos rojos (anemia).  Embarazo.  Vmitos, diarrea, fiebre u otras enfermedades que causan la prdida de lquidos (deshidratacin).  Enfermedades o trastornos, como enfermedad de Parkinson, presin alta (hipertensin arterial),  diabetes y problemas tiroideos.  Exposicin al calor extremo. DIAGNSTICO  El mdico le preguntar acerca de los sntomas, y Optometrist un examen fsico y un electrocardiograma (ECG) para registrar la actividad elctrica del corazn. Tambin podr realizarle otras pruebas cardacas o anlisis de sangre para determinar la causa de los [place]. Estos pueden ser:  Ecocardiograma transtorcico (ETT). Durante IT trainer, se usan ondas sonoras para evaluar cmo fluye la sangre por el corazn.  Ecocardiograma transesofgico (ETE).  Monitoreo cardaco. Este estudio permite que el mdico controle la frecuencia y el ritmo cardaco en tiempo real.  Monitor Holter. Es un dispositivo porttil que [employer] latidos cardacos y [place] a Retail buyer las arritmias cardacas. Le permite al [employer] registrar la actividad [place], si es necesario.  Pruebas de estrs por ejercicio o por medicamentos que aceleran los latidos cardacos. TRATAMIENTO  El tratamiento de los mareos depende de la causa de los sntomas y Economist. INSTRUCCIONES PARA EL CUIDADO EN EL HOGAR   Beba suficiente lquido para mantener la orina clara o de color amarillo plido. Esto es realmente importante cuando el clima es muy caluroso. En los adultos [place], tambin es importante cuando hace fro.  Tome los medicamentos exactamente como se lo hayan indicado, si los mareos se deben a los medicamentos. Cuando tome medicamentos para la presin arterial, es muy importante que se levante lentamente.  Levntese de las sillas con lentitud y apyese hasta sentirse bien.  Por la maana, sintese primero a un lado de la cama. Cuando se sienta bien, pngase lentamente de pie mientras se sostiene de algo, hasta que sepa que  ha logrado el equilibrio.  Mueva las piernas con frecuencia si debe estar de pie en un lugar durante mucho tiempo. Mientras est de pie, contraiga y relaje los msculos de las  piernas.  Pdale a alguna persona que lo acompae durante 1 o 2das si los mareos siguen siendo un problema. Debe estar acompaado hasta que se sienta lo suficientemente bien como para estar solo. Pdale a la persona que llame al mdico si observa cambios en usted que sean preocupantes.  No conduzca vehculos ni utilice maquinarias pesadas si se siente mareado.  No beba alcohol. SOLICITE ATENCIN MDICA DE INMEDIATO SI:   [place].  Siente nuseas o vomita.  Tiene dificultad para hablar, para caminar o para [employer], las [place] piernas.  Se siente dbil.  No piensa con claridad o tiene dificultades para armar oraciones. Es posible que un amigo o un familiar adviertan que esto ocurre.  Tiene dolor de pecho, dolor abdominal, sudoracin o Risk manager.  Hay cambios en la visin.  Observa un sangrado.  Tiene efectos secundarios del medicamento que parecen Chief Operating Officer de Teacher, English as a foreign language. ASEGRESE DE QUE:   Comprende estas instrucciones.  Controlar su afeccin.  Recibir ayuda de inmediato si no mejora o si empeora. Document Released: 07/05/2005 Document Revised: 07/10/2013 [facility] Patient Information 2015 [place]. This information is not intended to replace advice given to you by your health care provider. Make sure you discuss any questions you have with your health care provider.

## 2014-02-25 NOTE — Assessment & Plan Note (Addendum)
Pt reports her left eye "has a bump" in it that comes and goes.  Denies any changes vision, watery/itchy eyes, or discharge.  It is not present today but she is concerned that it comes and goes.  She was diagnosed with conjunctivitis previously.  It is possible that she has a stye that comes and goes.  She has not seen opthalmology which I suggested.  Possible etiologies include: conjunctivitis, stye, foreign body, dry eye syndrome.  On exam, conjunctivae does not appear injected and there is no drainage.   -consider referral to opthalmology if symptoms persist or worsen

## 2014-02-25 NOTE — Progress Notes (Addendum)
Patient ID: Kelly Atkinson, female   DOB: [birth year], 38 y.o.   MRN: [id number]    Subjective:   Patient ID: Kelly Atkinson female    DOB: [birth year] 38 y.o.    MRN: [id number] Health Maintenance Due: Health Maintenance Due  Topic Date Due  . Influenza Vaccine  02/16/2014    _________________________________________________  HPI: Kelly Atkinson is a 38 y.o. female here for an acute visit.  Pt has a PMH outlined below.  Please see problem-based charting assessment and plan note for further details of medical issues addressed at today's visit.  PMH: Past Medical History  Diagnosis Date  . GERD (gastroesophageal reflux disease)   . Low back pain     Medications: Current Outpatient Prescriptions on File Prior to Visit  Medication Sig Dispense Refill  . acetaminophen (TYLENOL) 500 MG tablet Take 1,000 mg by mouth every 6 (six) hours as needed for mild pain or headache.      . Prenatal Vit-Fe Fumarate-FA (PRENATAL MULTIVITAMIN) TABS tablet Take 1 tablet by mouth daily at 12 noon.       No current facility-administered medications on file prior to visit.    Allergies: Allergies  Allergen Reactions  . Ibuprofen Anaphylaxis    FH: Family History  Problem Relation Age of Onset  . Diabetes Mother   . Hypertension Mother   . Stomach cancer Mother   . Stroke Father     Died     SH: History   Social History  . Marital Status: Single    Spouse Name: N/A    Number of Children: 2  . Years of Education: N/A   Occupational History  . Oncologist   . FOLDER    Social History Main Topics  . Smoking status: Never Smoker   . Smokeless tobacco: Not on file  . Alcohol Use: No  . Drug Use: No  . Sexual Activity: Not Currently   Other Topics Concern  . 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  She is standing and does not have to slouch or bend her back while working.  She has been working  at [employer] for 4 years.    Review of Systems: Constitutional: Negative for fever, chills and weight loss.  Eyes: Negative for blurred vision.  Respiratory: Negative for cough and shortness of breath.  Cardiovascular: Negative for chest pain, palpitations and leg swelling.  Gastrointestinal: Negative for nausea, vomiting, abdominal pain, diarrhea, constipation and blood in stool.  Genitourinary: Negative for dysuria, urgency and frequency.  Musculoskeletal: Negative for myalgias and back pain.  Neurological: +dizziness, -weakness and -headaches.     Objective:   Vital Signs: There were no vitals filed for this visit.    BP Readings from Last 3 Encounters:  12/03/13 95/66  11/17/13 113/75  10/18/13 90/53    Physical Exam: Constitutional: Vital signs reviewed.  Patient is well-developed and well-nourished in NAD and cooperative with exam.  Head: Normocephalic and atraumatic. Eyes: PERRL, EOMI, conjunctivae nl, no scleral icterus.  Neck: Supple. Cardiovascular: RRR, no MRG. Pulmonary/Chest: normal effort, non-tender to palpation, CTAB, no wheezes, rales, or rhonchi. Abdominal: Soft. NT/ND +BS. Neurological: A&O x3, cranial nerves II-XII are grossly intact, moving all extremities. Extremities: 2+DP b/l; no pitting edema. Skin: Warm, dry and intact. No rash.  Most Recent Laboratory Results:  CMP     Component Value Date/Time   NA 138 04/11/2013 1631   K  3.9 04/11/2013 1631   CL 104 04/11/2013 1631   CO2 28 04/11/2013 1631   GLUCOSE 98 04/11/2013 1631   BUN 13 04/11/2013 1631   CREATININE 0.62 04/11/2013 1631   CREATININE 0.63 04/08/2010 2259   CALCIUM 9.4 04/11/2013 1631   PROT 7.3 04/11/2013 1631   ALBUMIN 4.2 04/11/2013 1631   AST 22 04/11/2013 1631   ALT 14 04/11/2013 1631   ALKPHOS 64 04/11/2013 1631   BILITOT 0.4 04/11/2013 1631   GFRNONAA >89 04/11/2013 1631   GFRAA >89 04/11/2013 1631    CBC    Component Value Date/Time   WBC 6.8 07/03/2012 1647   RBC 4.18  07/03/2012 1647   HGB 12.8 07/03/2012 1647   HCT 37.3 07/03/2012 1647   PLT 164 07/03/2012 1647   MCV 89.2 07/03/2012 1647   MCH 30.6 07/03/2012 1647   MCHC 34.3 07/03/2012 1647   RDW 13.7 07/03/2012 1647   LYMPHSABS 2.3 04/08/2010 2259   MONOABS 0.7 04/08/2010 2259   EOSABS 0.1 04/08/2010 2259   BASOSABS 0.0 04/08/2010 2259    Lipid Panel Lab Results  Component Value Date   CHOL 118 07/03/2012   HDL 50 07/03/2012   LDLCALC 54 07/03/2012   TRIG 72 07/03/2012   CHOLHDL 2.4 07/03/2012    HA1C No results found for this basename: HGBA1C    Urinalysis    Component Value Date/Time   COLORURINE YELLOW 11/17/2013 [place] 11/17/2013 1145   LABSPEC 1.010 11/17/2013 1145   PHURINE 6.0 11/17/2013 1145   GLUCOSEU NEGATIVE 11/17/2013 1145   GLUCOSEU NEG mg/dL 09/26/2009 1712   HGBUR LARGE* 11/17/2013 1145   HGBUR trace-intact 09/26/2009 1456   BILIRUBINUR NEGATIVE 11/17/2013 1145   BILIRUBINUR Negative 09/28/2013 1618   KETONESUR NEGATIVE 11/17/2013 1145   PROTEINUR NEGATIVE 11/17/2013 1145   PROTEINUR Negative 09/28/2013 1618   UROBILINOGEN 0.2 11/17/2013 1145   UROBILINOGEN 0.2 09/28/2013 1618   NITRITE NEGATIVE 11/17/2013 1145   NITRITE Negative 09/28/2013 1618   LEUKOCYTESUR NEGATIVE 11/17/2013 1145    Urine Microalbumin Lab Results  Component Value Date   MICROALBUR 0.50 02/11/2011    Imaging N/A   Assessment & Plan:   Assessment and plan was discussed and formulated with my attending.

## 2014-02-25 NOTE — Assessment & Plan Note (Signed)
Pt is a Spanish speaking female who is with an interpretor who is translating.  Pt reports dizziness for the past several days mainly while walking and upon standing, denies a sensation of room spinning.  Reports she feels lightheaded like she may pass out.  Denies any changes in vision, SOB, CP, palpitations, weakness. Denies any syncope or recent illness.  No hearing loss.  She has experienced dizziness in the past and reports this episode is similar but is longer in duration.  Reports LMP of 02/15/14.  States her menstrual periods usually last about 5 days and are initially heavy for the first couple of days.  Denies she may be pregnant but does not use contraception and is trying to get pregnant.  BP is soft but she is a petite lady and this seems to be her normal.  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  Possible etiologies include orthostatic, pregnancy, anemia.  Unremarkable physical exam; orthostatics neg.  -check urine preg, cbc/diff (anemia) -encouraged pt to stay hydrated in the heat and to stand up slowly from a sitting position  -advised to RTC if her symptoms fail to improve or worsen

## 2014-02-25 NOTE — Progress Notes (Signed)
Case discussed with Dr. Gill at the time of the visit.  We reviewed the resident's history and exam and pertinent patient test results.  I agree with the assessment, diagnosis, and plan of care documented in the resident's note. 

## 2014-03-13 ENCOUNTER — Ambulatory Visit: Payer: No Typology Code available for payment source

## 2014-04-17 ENCOUNTER — Encounter: Payer: Self-pay | Admitting: Internal Medicine

## 2014-04-17 ENCOUNTER — Ambulatory Visit (INDEPENDENT_AMBULATORY_CARE_PROVIDER_SITE_OTHER): Payer: No Typology Code available for payment source | Admitting: Internal Medicine

## 2014-04-17 VITALS — BP 96/54 | HR 80 | Temp 98.3°F | Ht 61.5 in | Wt 129.4 lb

## 2014-04-17 DIAGNOSIS — O039 Complete or unspecified spontaneous abortion without complication: Secondary | ICD-10-CM

## 2014-04-17 DIAGNOSIS — M546 Pain in thoracic spine: Secondary | ICD-10-CM

## 2014-04-17 MED ORDER — CYCLOBENZAPRINE HCL 10 MG PO TABS: 10.0000 mg | ORAL_TABLET | Freq: Every day | ORAL | Status: AC

## 2014-04-17 NOTE — Assessment & Plan Note (Signed)
Pt reports back pain x 2 weeks with acute worsening x 3 days. Pain likely MSK in etiology as there was no radiation of pain or radiculopathy symptoms. Will give flexeril 10mg  qhs x 2 weeks. If no improvement in pain will get a T-spine xray.

## 2014-04-17 NOTE — Patient Instructions (Signed)
Start taking flexeril 10mg  once at bedtime for back pain. This medication can make you drowsy.

## 2014-04-17 NOTE — Progress Notes (Signed)
Subjective:     Patient ID: Kelly Atkinson, female   DOB: [birth year], 38 y.o.   MRN: [id number]  Back Pain Pertinent negatives include no chest pain, fever, numbness or weakness.   Pt is a 38 y/o female w/ PMHx of GERD and miscarriage who presents to clinic for back pain x 2 weeks w/ acute worsening x 3 days. Back pain began 2 weeks ago and work patient up from sleep. She denies heavy lifting, moving, exercise, trauma, falls, or any unusual activity prior to when back pain began. Back pain was intermittent and 3/10 in severity until 3 days ago when back pain became constant and 5/10 in severity. Pain starts at mid back up to her neck. Denies radiation of pain down arms or legs. Pt has tried tylenol w/o relief in pain. Only moving neck and arms aggravates back pain. Pt describes pain as a muscle ache.    Review of Systems  Constitutional: Negative for fever and activity change.  HENT: Positive for congestion, sinus pressure and voice change (hoarseness).   Eyes: Negative for visual disturbance.  Respiratory: Negative for shortness of breath.   Cardiovascular: Negative for chest pain.  Gastrointestinal: Negative for nausea and vomiting.  Musculoskeletal: Positive for back pain. Negative for gait problem.  Neurological: Negative for weakness and numbness.       Objective:   Physical Exam  Constitutional: She appears well-developed and well-nourished. No distress.  HENT:  Head: Normocephalic.  Neck:  Unable to fully flex neck   Cardiovascular: Normal rate and regular rhythm.   No murmur heard. Pulmonary/Chest: Effort normal and breath sounds normal. She has no wheezes.  Abdominal: Soft. Bowel sounds are normal. There is no tenderness.  Musculoskeletal: She exhibits no tenderness.  Full range of motion in shoulders b/l, pain when abducting arms above shoulder height but able to, no tenderness to palpation of shoulder joint, negative spinal tenderness or step offs noted   Skin: Skin  is warm and dry.       Assessment:     Please see problem based assessment and plan.       Plan:     Please see problem based assessment and plan.

## 2014-04-17 NOTE — Assessment & Plan Note (Signed)
Pt reports having a miscarriage 3 months ago. She is having the same type of pelvic pain she had during pregnancy and is requesting referral to women's clinic.   - gyn referral made, clinic nurse Lela aware of referral.

## 2014-04-19 ENCOUNTER — Ambulatory Visit: Payer: No Typology Code available for payment source | Admitting: Internal Medicine

## 2014-04-19 NOTE — Progress Notes (Signed)
INTERNAL MEDICINE TEACHING ATTENDING ADDENDUM - Kelly Contes, MD: I personally saw and evaluated [name] in this clinic visit in conjunction with the resident, Dr. Hulen Atkinson. I have discussed patient's plan of care with medical resident during this visit. I have confirmed the physical exam findings and have read and agree with the clinic note including the plan with the following addition: - Pt here with worsening back pain likely MSK in origin - c/w flexeril and tylenol prn - Follow up in 2 weeks. If no improvement will consider xray - F/u gyn for pelvic pain s/p miscarriage

## 2014-05-08 ENCOUNTER — Encounter: Payer: Self-pay | Admitting: Family Medicine

## 2014-05-20 ENCOUNTER — Encounter: Payer: Self-pay | Admitting: Internal Medicine

## 2014-05-24 ENCOUNTER — Ambulatory Visit (INDEPENDENT_AMBULATORY_CARE_PROVIDER_SITE_OTHER): Payer: Self-pay | Admitting: *Deleted

## 2014-05-24 ENCOUNTER — Ambulatory Visit: Payer: Self-pay | Admitting: *Deleted

## 2014-05-24 DIAGNOSIS — Z23 Encounter for immunization: Secondary | ICD-10-CM

## 2014-06-06 ENCOUNTER — Ambulatory Visit (INDEPENDENT_AMBULATORY_CARE_PROVIDER_SITE_OTHER): Payer: No Typology Code available for payment source | Admitting: Family Medicine

## 2014-06-06 ENCOUNTER — Encounter: Payer: Self-pay | Admitting: Family Medicine

## 2014-06-06 ENCOUNTER — Other Ambulatory Visit: Payer: Self-pay | Admitting: Family Medicine

## 2014-06-06 VITALS — BP 91/53 | HR 63 | Temp 98.0°F | Wt 127.5 lb

## 2014-06-06 DIAGNOSIS — R102 Pelvic and perineal pain: Secondary | ICD-10-CM

## 2014-06-06 NOTE — Progress Notes (Signed)
Subjective:    Patient ID: Kelly Atkinson, female    DOB: Aug 22, 1975, 38 y.o.   MRN: [id number]  HPI This is a 38 year old G3 P2 012 status post spontaneous abortion approximately 6 months ago. She was referred to our clinic from her PCP due to continued abdominal pain. She had constant left lower quadrant pain during her pregnancy. This pain improved after the miscarriage, but has not gone away. She describes the pain as intermittent and as an ache. She rates the pain a 2 out of 10. The pain is mostly on her left side but occasionally on the right side. She will have the pain for several minutes and then the pain goes away.  She may have several episodes of pain doing the day and she may have the pain several days in a row. The pain happens at random intervals throughout the month. There are no palliating or provoking factors.  Past Medical History  Diagnosis Date  . GERD (gastroesophageal reflux disease)   . Low back pain    Past Surgical History  Procedure Laterality Date  . Wisdom tooth extraction     History   Social History  . Marital Status: Single    Spouse Name: N/A    Number of Children: 2  . Years of Education: N/A   Occupational History  . Oncologist   . FOLDER    Social History Main Topics  . Smoking status: Never Smoker   . Smokeless tobacco: Not on file  . Alcohol Use: No  . Drug Use: No  . Sexual Activity: Not Currently   Other Topics Concern  . 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  She is standing and does not have to slouch or bend her back while working.  She has been working at [employer] for 4 years.     Review of Systems  Constitutional: Negative for fever, chills and fatigue.  Respiratory: Negative for cough, shortness of breath and wheezing.   Cardiovascular: Negative for chest pain and palpitations.  Gastrointestinal: Negative for nausea, vomiting, abdominal pain,  diarrhea, constipation and rectal pain.  Genitourinary: Negative for dysuria, urgency, flank pain, decreased urine volume, vaginal bleeding, vaginal discharge, vaginal pain and pelvic pain.  Musculoskeletal: Negative for myalgias and arthralgias.       Objective:   Physical Exam  Constitutional: She is oriented to person, place, and time. She appears well-developed and well-nourished.  HENT:  Head: Normocephalic and atraumatic.  Eyes: Conjunctivae are normal. Pupils are equal, round, and reactive to light.  Cardiovascular: Normal rate and regular rhythm.  Exam reveals no gallop and no friction rub.   No murmur heard. Pulmonary/Chest: Effort normal and breath sounds normal. No respiratory distress. She has no wheezes. She has no rales. She exhibits no tenderness.  Abdominal: Soft. Bowel sounds are normal. She exhibits no distension and no mass. There is no tenderness. There is no rebound and no guarding. Hernia confirmed negative in the right inguinal area and confirmed negative in the left inguinal area.  Genitourinary: No labial fusion. There is no rash, tenderness, lesion or injury on the right labia. There is no rash, tenderness, lesion or injury on the left labia. Uterus is not deviated, not enlarged, not fixed and not tender. Cervix exhibits no motion tenderness, no discharge and no friability. Right adnexum displays no mass, no tenderness and no fullness. Left adnexum displays no mass,  no tenderness and no fullness. No erythema, tenderness or bleeding in the vagina. No foreign body around the vagina. No signs of injury around the vagina. No vaginal discharge found.  Lymphadenopathy:       Right: No inguinal adenopathy present.       Left: No inguinal adenopathy present.  Neurological: She is alert and oriented to person, place, and time.  Skin: Skin is warm and dry.  Psychiatric: She has a normal mood and affect. Her behavior is normal. Judgment and thought content normal.          Assessment & Plan:   Problem List Items Addressed This Visit    None    Visit Diagnoses    Pelvic pain in female    -  Primary    Relevant Orders       US Transvaginal Non-OB       GC/chlamydia probe amp, genital       Wet prep, genital      No apparent causes of pelvic pain.  Will get wet prep, GC/CT, pelvic US.  Will call pt with results.

## 2014-06-06 NOTE — Progress Notes (Signed)
Patient had miscarriage in May has continued to have pelvic pain since that time.

## 2014-06-07 LAB — GC/CHLAMYDIA PROBE AMP
CT PROBE, AMP APTIMA: NEGATIVE
GC Probe RNA: NEGATIVE

## 2014-06-07 LAB — WET PREP, GENITAL
Clue Cells Wet Prep HPF POC: NONE SEEN
TRICH WET PREP: NONE SEEN
WBC WET PREP: NONE SEEN
YEAST WET PREP: NONE SEEN

## 2014-06-18 ENCOUNTER — Other Ambulatory Visit: Payer: Self-pay | Admitting: Family Medicine

## 2014-06-18 ENCOUNTER — Ambulatory Visit (HOSPITAL_COMMUNITY): Admission: RE | Admit: 2014-06-18 | Payer: Self-pay | Source: Ambulatory Visit

## 2014-06-18 DIAGNOSIS — R102 Pelvic and perineal pain: Secondary | ICD-10-CM

## 2015-05-05 ENCOUNTER — Ambulatory Visit: Payer: Self-pay

## 2015-09-15 ENCOUNTER — Encounter: Payer: Self-pay | Admitting: Internal Medicine

## 2015-09-15 ENCOUNTER — Ambulatory Visit (INDEPENDENT_AMBULATORY_CARE_PROVIDER_SITE_OTHER): Payer: Self-pay | Admitting: Internal Medicine

## 2015-09-15 VITALS — BP 90/48 | HR 78 | Temp 98.3°F | Ht 61.5 in | Wt 133.1 lb

## 2015-09-15 DIAGNOSIS — Z23 Encounter for immunization: Secondary | ICD-10-CM

## 2015-09-15 DIAGNOSIS — R21 Rash and other nonspecific skin eruption: Secondary | ICD-10-CM

## 2015-09-15 DIAGNOSIS — Z0000 Encounter for general adult medical examination without abnormal findings: Secondary | ICD-10-CM | POA: Insufficient documentation

## 2015-09-15 DIAGNOSIS — N926 Irregular menstruation, unspecified: Secondary | ICD-10-CM

## 2015-09-15 MED ORDER — CLOTRIMAZOLE 1 % EX CREA: 1.0000 "application " | TOPICAL_CREAM | Freq: Two times a day (BID) | CUTANEOUS | Status: DC

## 2015-09-15 NOTE — Progress Notes (Signed)
   Subjective:   Patient ID: Kelly Atkinson female   DOB: [birth year] 40 y.o.   MRN: [id number]  HPI: [name] is a 40 y.o. female w/ PMHx of GERD and chronic low back pain, presents to the clinic today for an acute visit for rash on her neck. Says she has had this for 3-4 months, describes it as itchy with some intermittent burning when she scratches it. Has not changed any lotions, detergents, worn jewelry, different soaps, although she does state that she bought some second hand clothes and wore a shirt without washing it and feels that this started after that. Has never had this before. No significant pain. Has not work the clothes again since that time (in November). Does state she has a fungal infection on her foot.   She also mentions some dry itchy eyes, only during the winter months. Also states she has had somewhat irregular periods since she had her miscarriage back in 12/2013. Denies any discharge, changes in weight, weakness, or fatigue. Says she will have her period every 2-4 weeks, regular course (5 days), normal flow.    Past Medical History  Diagnosis Date  . GERD (gastroesophageal reflux disease)   . Low back pain    Current Outpatient Prescriptions  Medication Sig Dispense Refill  . acetaminophen (TYLENOL) 500 MG tablet Take 1,000 mg by mouth every 6 (six) hours as needed for mild pain or headache.    . Prenatal Vit-Fe Fumarate-FA (PRENATAL MULTIVITAMIN) TABS tablet Take 1 tablet by mouth daily at 12 noon.     No current facility-administered medications for this visit.    Review of Systems: General: Denies fever, chills, diaphoresis, appetite change and fatigue.  Respiratory: Denies SOB, DOE, cough, and wheezing.   Cardiovascular: Denies chest pain and palpitations.  Gastrointestinal: Denies nausea, vomiting, abdominal pain, and diarrhea.  Genitourinary: Denies dysuria, increased frequency, and flank pain. Endocrine: Denies hot or cold intolerance,  polyuria, and polydipsia. Musculoskeletal: Denies myalgias, back pain, joint swelling, arthralgias and gait problem.  Skin: Positive for rash (see below). Denies pallor, and wounds.  Neurological: Denies dizziness, seizures, syncope, weakness, lightheadedness, numbness and headaches.  Psychiatric/Behavioral: Denies mood changes, and sleep disturbances.  Objective:   Physical Exam: Filed Vitals:   09/15/15 0920  BP: 90/48  Pulse: 78  Temp: 98.3 F (36.8 C)  Height: 5' 1.5" (1.562 m)  Weight: 133 lb 1.6 oz (60.374 kg)  SpO2: 100%    General: Alert, cooperative, NAD. HEENT: PERRL, EOMI. Moist mucus membranes Neck: Full range of motion without pain, supple, no lymphadenopathy or carotid bruits Lungs: Clear to ascultation bilaterally, normal work of respiration, no wheezes, rales, rhonchi Heart: RRR, no murmurs, gallops, or rubs Abdomen: Soft, non-tender, non-distended, BS + Extremities: No cyanosis, clubbing, or edema Neurologic: Alert & oriented X3, cranial nerves II-XII intact, strength grossly intact, sensation intact to light touch Skin: Left neck with well demarcated, slightly elevated erythematous patch, with scaly appearance in certain locations. Mild excoriations. Also smaller lesion on posterior neck that appears similar, dry, scaly.       Assessment & Plan:   Please see problem based assessment and plan.

## 2015-09-15 NOTE — Patient Instructions (Signed)
1. Please make a follow up appointment for 2 weeks.   2. Please take all medications as previously prescribed with the following changes:  Start using Clotrimazole cream twice daily on rash on neck.   We will check your blood counts given your irregular menses for iron deficiency anemia.   Use a saline eye drop for itchy dry eyes. If this doesn't help, we can try something different at your next appointment.   3. If you have worsening of your symptoms or new symptoms arise, please call the clinic [phone] or go to the ER immediately if symptoms are severe.

## 2015-09-15 NOTE — Assessment & Plan Note (Signed)
Well demarcated, erythematous, scaly plaque/macular appearing lesion on her neck. Says it itches and sometimes burns if she scratches it. No systemic symptoms. Has had this for 3 months or so. No recent lotions, detergents, soaps, or jewelry. Has never had a reaction to watch, rings, bracelets, etc in the past. Does not look like pattern of contact dermatitis. Suspect this is tinea corporis given physical exam findings and patient description.  -Trial of clotrimazole cream bid until resolution -If no improvement with fungal treatment, RTC in 2 weeks, can consider scraping, biopsy, or trial of topical steroid.

## 2015-09-15 NOTE — Assessment & Plan Note (Signed)
Flu shot given today

## 2015-09-15 NOTE — Assessment & Plan Note (Signed)
States she has had irregular menses ever since her miscarriage in 12/2013. Has seen an OB/GYN since that time. Says she has her period every 2-4 weeks, regular flow and length. No discharge, discomfort, weight change. States she cannot be pregnant.  -Check CBC for anemia + Iron, Ferritin -RTC in 2 weeks

## 2015-09-16 LAB — CBC WITH DIFFERENTIAL/PLATELET
BASOS: 0 %
Basophils Absolute: 0 10*3/uL (ref 0.0–0.2)
EOS (ABSOLUTE): 0.1 10*3/uL (ref 0.0–0.4)
Eos: 1 %
HEMATOCRIT: 37.9 % (ref 34.0–46.6)
HEMOGLOBIN: 12.7 g/dL (ref 11.1–15.9)
Immature Grans (Abs): 0 10*3/uL (ref 0.0–0.1)
Immature Granulocytes: 0 %
Lymphocytes Absolute: 1.4 10*3/uL (ref 0.7–3.1)
Lymphs: 29 %
MCH: 31 pg (ref 26.6–33.0)
MCHC: 33.5 g/dL (ref 31.5–35.7)
MCV: 92 fL (ref 79–97)
MONOS ABS: 0.4 10*3/uL (ref 0.1–0.9)
Monocytes: 8 %
NEUTROS ABS: 2.9 10*3/uL (ref 1.4–7.0)
Neutrophils: 62 %
Platelets: 175 10*3/uL (ref 150–379)
RBC: 4.1 x10E6/uL (ref 3.77–5.28)
RDW: 13.8 % (ref 12.3–15.4)
WBC: 4.8 10*3/uL (ref 3.4–10.8)

## 2015-09-16 LAB — IRON AND TIBC
IRON SATURATION: 47 % (ref 15–55)
Iron: 139 ug/dL (ref 27–159)
TIBC: 295 ug/dL (ref 250–450)
UIBC: 156 ug/dL (ref 131–425)

## 2015-09-16 LAB — FERRITIN: Ferritin: 53 ng/mL (ref 15–150)

## 2015-09-16 NOTE — Progress Notes (Signed)
Internal Medicine Clinic Attending  Case discussed with Dr. Jones at the time of the visit.  We reviewed the resident's history and exam and pertinent patient test results.  I agree with the assessment, diagnosis, and plan of care documented in the resident's note.  

## 2015-09-29 ENCOUNTER — Encounter: Payer: Self-pay | Admitting: Internal Medicine

## 2015-09-29 ENCOUNTER — Ambulatory Visit (INDEPENDENT_AMBULATORY_CARE_PROVIDER_SITE_OTHER): Payer: Self-pay | Admitting: Internal Medicine

## 2015-09-29 VITALS — BP 95/56 | HR 82 | Temp 98.1°F | Ht 61.5 in | Wt 136.3 lb

## 2015-09-29 DIAGNOSIS — R21 Rash and other nonspecific skin eruption: Secondary | ICD-10-CM

## 2015-09-29 DIAGNOSIS — O0001 Abdominal pregnancy with intrauterine pregnancy: Secondary | ICD-10-CM

## 2015-09-29 DIAGNOSIS — Z331 Pregnant state, incidental: Secondary | ICD-10-CM

## 2015-09-29 DIAGNOSIS — N926 Irregular menstruation, unspecified: Secondary | ICD-10-CM

## 2015-09-29 DIAGNOSIS — B001 Herpesviral vesicular dermatitis: Secondary | ICD-10-CM | POA: Insufficient documentation

## 2015-09-29 DIAGNOSIS — N9489 Other specified conditions associated with female genital organs and menstrual cycle: Secondary | ICD-10-CM

## 2015-09-29 LAB — POCT URINE PREGNANCY: Preg Test, Ur: POSITIVE — AB

## 2015-09-29 MED ORDER — PRENATAL VITAMINS 0.8 MG PO TABS: 1.0000 | ORAL_TABLET | Freq: Every day | ORAL | Status: DC

## 2015-09-29 MED ORDER — ACYCLOVIR 400 MG PO TABS: 400.0000 mg | ORAL_TABLET | Freq: Three times a day (TID) | ORAL | Status: AC

## 2015-09-29 NOTE — Progress Notes (Signed)
Patient ID: Kelly Atkinson, female   DOB: 1976/04/29, 40 y.o.   MRN: [id number] [facility] Subjective:   Patient ID: Kelly Atkinson female   DOB: [birth year] 40 y.o.   MRN: [id number]  HPI: Ms.Shahla Silva-Gutierrez is a 40 y.o. female with gastroesophageal reflux disease presenting for follow-up of hyperpigmented patch on anterior neck and irregular menses, and a new complaint of herpes labialis.  Hyperpigmented patch on anterior neck: Dr. Ronnald Ramp saw her two weeks ago and felt she had tinea corporis on her left anterior neck. She has been using topical clotrimazole cream twice daily and the lesion no longer itches and appears to have improved.  Irregular menses: Since her spontaneous abortion in 2014, she's had irregular menses every 4-6 weeks. She's not on any birth control. Her last period was on January 25.  Herpes labialis: She's had a small vesicle on her right lower lip for the last week; this is the first time she's had this. She denies any lesions on her genitals.  She is not smoking and I have reviewed her medications with her today.  Please see the assessment and plan for the status of the patient's chronic medical problems.  Review of Systems  Constitutional: Negative for fever, chills and malaise/fatigue.  HENT: Negative for ear pain and sore throat.   Gastrointestinal: Negative for abdominal pain.  Genitourinary: Negative for dysuria, urgency and flank pain.  Skin: Positive for rash.  Neurological: Negative for headaches.   Objective:  Physical Exam: Filed Vitals:   09/29/15 0919  BP: 95/56  Pulse: 82  Temp: 98.1 F (36.7 C)  TempSrc: Oral  Height: 5' 1.5" (1.562 m)  Weight: 136 lb 4.8 oz (61.825 kg)  SpO2: 100%   General: very friendly Spanish-speaking lady resting in chair comfortably, appropriately conversational Cardiac: regular rate and rhythm, no rubs, murmurs or gallops Pulm: breathing well, clear to auscultation  bilaterally Abd: bowel sounds normal, soft, nondistended, non-tender Skin: 1. Small crop of vesicles on right vermilion border 2. Ill-defined hyperpigmented patch on left anterior neck   Assessment & Plan:  Case discussed with Dr. Lynnae January   Herpes simplex labialis She has small painful vesicles on the left vermilion border suggestive of herpes labialis. There is no involvement of the nasal tip suggestive of herpes opthalmicus nor are there any lesions in the posterior oropharynx suggestive of coxsackie. I've prescribed her acyclovir 400mg  three times daily for 5 days.  Rash and nonspecific skin eruption She has a hyperpigmented patch on her anterior neck that appears clinically most consistent with a fixed drug eruption or perhaps macular hyperpigmentation related to increased estrogen levels from her pregnancy. However, it improved with topical clotrimazole so this may have been an atypical tinea corporis. There is no overlying scale so I've recommended she stop taking the topical clotrimazole.  Abdominal pregnancy with intrauterine pregnancy Her last menstrual period was January 25. Today a urine pregnancy test was positive . She has 2 children and has had 1 spontaneous abortion back in 2014 of unknown cause. I've prescribed her pre-natal vitamins today and Gladys helped schedule her an appointment with her obstetritian.   Medications Ordered Meds ordered this encounter  Medications  . acyclovir (ZOVIRAX) 400 MG tablet    Sig: Take 1 tablet (400 mg total) by mouth 3 (three) times daily.    Dispense:  21 tablet    Refill:  0  . Prenatal Multivit-Min-Fe-FA (PRENATAL VITAMINS) 0.8 MG tablet    Sig: Take 1 tablet by mouth  daily.    Dispense:  90 tablet    Refill:  2   Other Orders Orders Placed This Encounter  Procedures  . POCT Urine Pregnancy   Follow Up: Return in about 3 months (around 12/30/2015).

## 2015-09-29 NOTE — Assessment & Plan Note (Addendum)
She has a hyperpigmented patch on her anterior neck that appears clinically most consistent with a fixed drug eruption or perhaps macular hyperpigmentation related to increased estrogen levels from her pregnancy. However, it improved with topical clotrimazole so this may have been an atypical tinea corporis. There is no overlying scale so I've recommended she stop taking the topical clotrimazole.

## 2015-09-29 NOTE — Assessment & Plan Note (Signed)
She has small painful vesicles on the left vermilion border suggestive of herpes labialis. There is no involvement of the nasal tip suggestive of herpes opthalmicus nor are there any lesions in the posterior oropharynx suggestive of coxsackie. I've prescribed her acyclovir 400mg  three times daily for 5 days.

## 2015-09-29 NOTE — Assessment & Plan Note (Deleted)
She has irregular menses suggestive of ovulatory dysfunction. I don't have a good answer why her menses are irregular but hypothyroidism is my best guess at this point. Another thought is that this could be premature primary ovarian insuffiency, supported by her spontaneous abortion back in 2014. She's quit young so I doubt she's physiologically perimenopausal. She does not have diabetes nor signs of hirsutism so I doubt PCOS, nor does she have changes in vision suggestive of a pituitary tumor. Today we'll check a TSH; if normal, we will refer to gynecology at the next visit.

## 2015-09-29 NOTE — Assessment & Plan Note (Signed)
Her last menstrual period was January 25. Today a urine pregnancy test was positive . She has 2 children and has had 1 spontaneous abortion back in 2014 of unknown cause. I've prescribed her pre-natal vitamins today and Gladys helped schedule her an appointment with her obstetritian.

## 2015-09-30 NOTE — Progress Notes (Signed)
Internal Medicine Clinic Attending  Case discussed with Dr. Flores at the time of the visit.  We reviewed the resident's history and exam and pertinent patient test results.  I agree with the assessment, diagnosis, and plan of care documented in the resident's note. 

## 2015-10-30 ENCOUNTER — Ambulatory Visit: Payer: Self-pay

## 2015-12-10 ENCOUNTER — Ambulatory Visit: Payer: Self-pay

## 2015-12-16 ENCOUNTER — Ambulatory Visit: Payer: Self-pay

## 2015-12-16 ENCOUNTER — Ambulatory Visit: Payer: Self-pay | Admitting: Internal Medicine

## 2016-01-21 ENCOUNTER — Other Ambulatory Visit: Payer: Self-pay | Admitting: Internal Medicine

## 2016-01-21 DIAGNOSIS — Z1231 Encounter for screening mammogram for malignant neoplasm of breast: Secondary | ICD-10-CM

## 2016-01-22 ENCOUNTER — Ambulatory Visit (INDEPENDENT_AMBULATORY_CARE_PROVIDER_SITE_OTHER): Payer: Self-pay | Admitting: Internal Medicine

## 2016-01-22 ENCOUNTER — Encounter: Payer: Self-pay | Admitting: Internal Medicine

## 2016-01-22 VITALS — BP 97/52 | HR 64 | Temp 98.5°F | Ht 61.0 in | Wt 139.6 lb

## 2016-01-22 DIAGNOSIS — R87619 Unspecified abnormal cytological findings in specimens from cervix uteri: Secondary | ICD-10-CM

## 2016-01-22 DIAGNOSIS — Z8759 Personal history of other complications of pregnancy, childbirth and the puerperium: Secondary | ICD-10-CM

## 2016-01-22 DIAGNOSIS — Z8742 Personal history of other diseases of the female genital tract: Secondary | ICD-10-CM

## 2016-01-22 DIAGNOSIS — K0889 Other specified disorders of teeth and supporting structures: Secondary | ICD-10-CM

## 2016-01-22 DIAGNOSIS — O0001 Abdominal pregnancy with intrauterine pregnancy: Secondary | ICD-10-CM

## 2016-01-22 DIAGNOSIS — R8290 Unspecified abnormal findings in urine: Secondary | ICD-10-CM

## 2016-01-22 LAB — POCT URINALYSIS DIPSTICK
Bilirubin, UA: NEGATIVE
Glucose, UA: NEGATIVE
Ketones, UA: NEGATIVE
LEUKOCYTES UA: NEGATIVE
NITRITE UA: NEGATIVE
PROTEIN UA: NEGATIVE
Spec Grav, UA: 1.015
UROBILINOGEN UA: 0.2

## 2016-01-22 LAB — POCT URINE PREGNANCY: PREG TEST UR: NEGATIVE

## 2016-01-22 NOTE — Patient Instructions (Signed)
Thank you for coming to see me today. It was a pleasure. Today we talked about:   Urine odor: the good news is that you do not have an infection in your urine.  We will check to make sure you are not having another pregnancy and will call you if those results are positive.  Unless you are trying to become pregnant or get pregnant, you can stop taking your Pre-Natal vitamin.  I will put in a referral for you to see a dentist and you will be contacted regarding an appointment.  Please follow-up with [name] as needed.  If you have any questions or concerns, please do not hesitate to call the office at [phone].  Take Care,   Jule Ser, DO

## 2016-01-23 DIAGNOSIS — R87810 Cervical high risk human papillomavirus (HPV) DNA test positive: Secondary | ICD-10-CM | POA: Insufficient documentation

## 2016-01-23 NOTE — Assessment & Plan Note (Signed)
A:  Patient presenting with 3 weeks of strong odor to her urine with no current symptoms of dysuria, abdominal pain, frequency to suggest a UTI.  Urine dipstick was negative for nitrites or leukocytes and was clear, and yellow in color.  P: - offered reassurance that UTI was unlikely and did not need antibiotics - encouraged hydration - follow up as needed

## 2016-01-23 NOTE — Assessment & Plan Note (Signed)
A: Patient reports to me that she had been receiving prenatal care at [facility] but had a spontaneous abortion back in April.  She is unable to tell me the cause of abortion.  She reports having had a Pap smear done last month there, but records are not currently available via Epic.  P: - instructed patient that she no longer needs to take prenatal vitamins if not trying to get pregnant again - can resume regular multivitamin - asked patient to fill out records request from health department - urine pregnancy checked today was negative

## 2016-01-23 NOTE — Assessment & Plan Note (Signed)
A: Patient had abnormal Pap smear back in Sept 2013 that was negative for intraepithelial lesion or malignancy (NIL M) but did have high risk HPV detected.  I do not see further records indicating she had repeat cytology and HPV testing although she does tell me she had Pap smear last [facility] at health department.  P: - asked patient to sign release to obtain records from health department - she should have had repeat cytology and HPV at 12 and 24 months with potential for colposcopy depending on those results.  If she did have repeat testing that showed NILM and was negative for HPV, she could have returned to appropriate screening internvals

## 2016-01-23 NOTE — Progress Notes (Signed)
Patient ID: Kelly Atkinson, female   DOB: 1975-08-14, 40 y.o.   MRN: [id number]   CC: "urine has a strong odor"  HPI:  [name] is a 40 y.o. woman with PMHx as below presenting today because of a strong odor with her urine.  She states symptoms began approximately 3 weeks ago.  At that time, she had very mild burning with urination that has since resolved.  She denies any pain while urinating currently, suprapubic abdominal pain, increased frequency, or flank pain.  No fevers, chills, nausea, or vomiting.  She denies any increased vaginal discharge.  No new sexual partners although she is active with her husband.  She reports her urine is not abnormally cloudy or dark.  Past Medical History  Diagnosis Date  . GERD (gastroesophageal reflux disease)   . Low back pain     Review of Systems:  Review of Systems  Constitutional: Negative for fever and chills.  Genitourinary: Positive for dysuria (resolved ). Negative for frequency and flank pain.       Strong odor to urine  Musculoskeletal: Negative for back pain.     Physical Exam:  Filed Vitals:   01/22/16 1004  BP: 97/52  Pulse: 64  Temp: 98.5 F (36.9 C)  TempSrc: Oral  Height: 5\' 1"  (1.549 m)  Weight: 139 lb 9.6 oz (63.322 kg)  SpO2: 99%   Physical Exam  Constitutional: She is oriented to person, place, and time and well-developed, well-nourished, and in no distress.  HENT:  Head: Normocephalic and atraumatic.  Eyes: EOM are normal.  Pulmonary/Chest: Effort normal.  Abdominal: Soft. There is no tenderness. There is no rebound and no guarding.  Musculoskeletal:  No CVA tenderness  Neurological: She is alert and oriented to person, place, and time.  Skin: Skin is warm and dry.     Assessment & Plan:   See encounters tab for problem based medical decision making.   Patient discussed with Dr. Dareen Piano  Abdominal pregnancy with intrauterine pregnancy A: Patient reports to me that she had been  receiving prenatal care at [facility] but had a spontaneous abortion back in April.  She is unable to tell me the cause of abortion.  She reports having had a Pap smear done last month there, but records are not currently available via Epic.  P: - instructed patient that she no longer needs to take prenatal vitamins if not trying to get pregnant again - can resume regular multivitamin - asked patient to fill out records request from health department - urine pregnancy checked today was negative  Abnormal urine odor A:  Patient presenting with 3 weeks of strong odor to her urine with no current symptoms of dysuria, abdominal pain, frequency to suggest a UTI.  Urine dipstick was negative for nitrites or leukocytes and was clear, and yellow in color.  P: - offered reassurance that UTI was unlikely and did not need antibiotics - encouraged hydration - follow up as needed  Abnormal Pap smear of cervix A: Patient had abnormal Pap smear back in Sept 2013 that was negative for intraepithelial lesion or malignancy (NIL M) but did have high risk HPV detected.  I do not see further records indicating she had repeat cytology and HPV testing although she does tell me she had Pap smear last month at health department.  P: - asked patient to sign release to obtain records from health department - she should have had repeat cytology and HPV at 12 and 24 months  with potential for colposcopy depending on those results.  If she did have repeat testing that showed NILM and was negative for HPV, she could have returned to appropriate screening internvals

## 2016-01-26 NOTE — Progress Notes (Signed)
Internal Medicine Clinic Attending  Case discussed with Dr. Wallace at the time of the visit.  We reviewed the resident's history and exam and pertinent patient test results.  I agree with the assessment, diagnosis, and plan of care documented in the resident's note.  

## 2016-01-28 ENCOUNTER — Ambulatory Visit
Admission: RE | Admit: 2016-01-28 | Discharge: 2016-01-28 | Disposition: A | Discharge status: Home / Self Care | Payer: No Typology Code available for payment source | Source: Ambulatory Visit | Attending: Obstetrics and Gynecology | Admitting: Obstetrics and Gynecology

## 2016-01-28 DIAGNOSIS — Z1231 Encounter for screening mammogram for malignant neoplasm of breast: Secondary | ICD-10-CM

## 2016-02-27 ENCOUNTER — Ambulatory Visit (INDEPENDENT_AMBULATORY_CARE_PROVIDER_SITE_OTHER): Payer: Self-pay | Admitting: Internal Medicine

## 2016-02-27 ENCOUNTER — Encounter: Payer: Self-pay | Admitting: Internal Medicine

## 2016-02-27 DIAGNOSIS — G4762 Sleep related leg cramps: Secondary | ICD-10-CM

## 2016-02-27 LAB — POCT URINE PREGNANCY: Preg Test, Ur: NEGATIVE

## 2016-02-27 NOTE — Progress Notes (Signed)
   CC: Nocturnal leg cramps  HPI:  Ms.Philomina Silva-Gutierrez is a 40 y.o. female who presents today for nocturnal leg cramps. Please see assessment & plan for status of chronic medical problems.   Past Medical History:  Diagnosis Date  . GERD (gastroesophageal reflux disease)   . Low back pain     Review of Systems:  Please see each problem below for a pertinent review of systems.  Physical Exam:  Vitals:   02/27/16 1544  BP: (!) 96/53  Pulse: 66  Temp: 98.7 F (37.1 C)  TempSrc: Oral  SpO2: 100%  Weight: 144 lb 3.2 oz (65.4 kg)  Height: 5\' 1"  (1.549 m)   Physical Exam  Constitutional: No distress.  HENT:  Head: Normocephalic and atraumatic.  Eyes: Conjunctivae are normal. No scleral icterus.  Cardiovascular: Normal rate and regular rhythm.   Pulmonary/Chest: Effort normal and breath sounds normal.  Musculoskeletal: She exhibits tenderness (Mild tenderness to with palpation of bilateral calf muscles.).  2+ dorsalis pedis pulses palpated bilaterally. 5/5 lower extremity strength bilaterally.  Skin: She is not diaphoretic.     Assessment & Plan:   See Encounters Tab for problem based charting.  Patient discussed with Dr. Dareen Piano

## 2016-02-27 NOTE — Assessment & Plan Note (Addendum)
Assessment For the past month, she has had intermittent episodes of leg cramps while sleeping that last 3-5 minutes. She cannot identify any alleviating or exacerbating factors and has not tried any medications to alleviate her symptoms. During the day, she is mostly sitting at work where she Phot[employer] and is treated with chemicals to keep mosquitoes away and has been doing so for the last 10 years. She does acknowledge that she works in a warm environment and drinks about 4-5 cups of water daily. She is still having regular menstrual periods which come on every 30 days and last for 3-5 days. Her only other current medications a prenatal vitamin. She otherwise denies fatigue, numbness, tingling, claudication, prior smoking history.  I suspect her symptoms are likely in the setting of dehydration or electrolyte abnormalities given her work environment and less than optimal fluid intake. The short time course of her symptoms also suggest in association with the current season, summer. Other possibilities include pregnancy, thyroid disease, positional issues from the way she is sitting, diabetes.  Plan -Recommend she increase her fluid intake -Check urine pregnancy test, TSH, BMET, magnesium as it has been 3-4 years since last checked per review of the chart -Inform her that I would give her call if anything came back abnormal -Consider checking for diabetes if she remains no better at follow-up  ADDENDUM 03/03/2016 at 12:06 PM EDT BMET, magnesium, TSH reassuring. Urine pregnancy negative. Spoke with patient and conveyed results. Encouraged her to increase oral intake of fluids.

## 2016-02-27 NOTE — Patient Instructions (Signed)
I will give you a call about the test results if anything else comes back abnormal, but it's probably a good idea to drink more water given the weather.   Here's one way to stretch your calf muscles.

## 2016-02-28 LAB — BMP8+ANION GAP
ANION GAP: 18 mmol/L (ref 10.0–18.0)
BUN/Creatinine Ratio: 21 (ref 9–23)
BUN: 14 mg/dL (ref 6–24)
CO2: 23 mmol/L (ref 18–29)
CREATININE: 0.67 mg/dL (ref 0.57–1.00)
Calcium: 8.8 mg/dL (ref 8.7–10.2)
Chloride: 100 mmol/L (ref 96–106)
GFR calc Af Amer: 126 mL/min/{1.73_m2} (ref >59)
GFR calc non Af Amer: 110 mL/min/{1.73_m2} (ref >59)
Glucose: 102 mg/dL — ABNORMAL HIGH (ref 65–99)
Potassium: 3.7 mmol/L (ref 3.5–5.2)
SODIUM: 141 mmol/L (ref 134–144)

## 2016-02-28 LAB — TSH: TSH: 2.94 u[IU]/mL (ref 0.450–4.500)

## 2016-02-28 LAB — MAGNESIUM: MAGNESIUM: 1.9 mg/dL (ref 1.6–2.3)

## 2016-03-01 NOTE — Progress Notes (Signed)
Internal Medicine Clinic Attending  Case discussed with Dr. Patel,Rushil soon after the resident saw the patient.  We reviewed the resident's history and exam and pertinent patient test results.  I agree with the assessment, diagnosis, and plan of care documented in the resident's note. 

## 2016-08-18 ENCOUNTER — Ambulatory Visit (INDEPENDENT_AMBULATORY_CARE_PROVIDER_SITE_OTHER): Payer: BLUE CROSS/BLUE SHIELD | Admitting: Internal Medicine

## 2016-08-18 DIAGNOSIS — R6889 Other general symptoms and signs: Secondary | ICD-10-CM

## 2016-08-18 DIAGNOSIS — R51 Headache: Secondary | ICD-10-CM

## 2016-08-18 DIAGNOSIS — M791 Myalgia: Secondary | ICD-10-CM | POA: Diagnosis not present

## 2016-08-18 DIAGNOSIS — Z886 Allergy status to analgesic agent status: Secondary | ICD-10-CM | POA: Diagnosis not present

## 2016-08-18 MED ORDER — TRAMADOL HCL 50 MG PO TABS: 50.0000 mg | ORAL_TABLET | Freq: Four times a day (QID) | ORAL | 0 refills | Status: DC | PRN

## 2016-08-18 NOTE — Progress Notes (Signed)
    CC: flu like symptoms of myalgias and headache HPI: [name] is a 41 y.o. woman with PMH noted below here for flu-like symptoms of myalgias and a headache.  Please see Problem List/A&P for the status of the patient's chronic medical problems   Past Medical History:  Diagnosis Date  . GERD (gastroesophageal reflux disease)   . Low back pain     Review of Systems: Denies fevers. Has some chills. Is fatigued. Denies cough, SOB, wheezing Has some headache. Denies n/v/d Has some myalgias   Physical Exam: Vitals:   08/18/16 1512  BP: (!) 97/59  Pulse: 65  Temp: 97.9 F (36.6 C)  TempSrc: Oral  SpO2: 100%  Weight: 148 lb 3.2 oz (67.2 kg)  Height: 5\' 1"  (1.549 m)    General: A&O, in NAD HEENT: MMM, pharynx without exudates or erythema. No lymphadenopathy. Sinuses nontender to palpation.  Neck: supple, midline trachea,  CV: RRR, normal s1, s2, no m/r/g,  Resp: equal and symmetric breath sounds, no wheezing or crackles  Abdomen: soft, nontender, nondistended, +BS Skin: warm, dry, intact   Assessment & Plan:   See encounters tab for problem based medical decision making. Patient discussed with Dr. Daryll Drown

## 2016-08-18 NOTE — Patient Instructions (Signed)
Thank you for your visit today  Please take tylenol scheduled 500 mg every 6 hours for your muscle pain  Please take tramadol only if needed- we have given you 6 tablets.

## 2016-08-18 NOTE — Assessment & Plan Note (Signed)
Patient says she has had squeezing type frontal headache for 2 weeks, and then myalgias for a week now. Everyone else in the house is sick including her husband and kids. She denies any fevers, cough, dyspnea but has some chills. She denies nausea, vomiting or diarrhea. She has not taken the flu shot for this cycle.  She has tried tylenol few times. She is allergic to ibuprofen and it causes swelling of throat and face. She tried aleve and similar thing happened.  Plan -Given that she has had these symptoms for 2 weeks now, will not treat with tamiflu.  -scheduled tylenol q6 hours  -given tramadol for breakthrough pain  -advised to avoid NSAIDs -RTC if symptoms persist beyond 10 days

## 2016-08-19 NOTE — Progress Notes (Signed)
Internal Medicine Clinic Attending  Case discussed with Dr. Tiburcio Pea at the time of the visit.  We reviewed the resident's history and exam and pertinent patient test results.  I agree with the assessment, diagnosis, and plan of care documented in the resident's note.  BAsed on paucity of symptoms, did not appear to have flu but more likely tension headache, possibly viral sinusitis.  Given medication to help with headache.  She should have return precautions explained to her as well.

## 2016-12-03 ENCOUNTER — Ambulatory Visit (INDEPENDENT_AMBULATORY_CARE_PROVIDER_SITE_OTHER): Payer: BLUE CROSS/BLUE SHIELD | Admitting: Internal Medicine

## 2016-12-03 ENCOUNTER — Encounter (INDEPENDENT_AMBULATORY_CARE_PROVIDER_SITE_OTHER): Payer: Self-pay

## 2016-12-03 ENCOUNTER — Encounter: Payer: Self-pay | Admitting: Internal Medicine

## 2016-12-03 VITALS — BP 94/53 | HR 66 | Temp 98.0°F | Ht 61.0 in | Wt 149.1 lb

## 2016-12-03 DIAGNOSIS — Z8742 Personal history of other diseases of the female genital tract: Secondary | ICD-10-CM | POA: Diagnosis not present

## 2016-12-03 DIAGNOSIS — Z8759 Personal history of other complications of pregnancy, childbirth and the puerperium: Secondary | ICD-10-CM | POA: Diagnosis not present

## 2016-12-03 DIAGNOSIS — G4762 Sleep related leg cramps: Secondary | ICD-10-CM | POA: Diagnosis not present

## 2016-12-03 DIAGNOSIS — R1011 Right upper quadrant pain: Secondary | ICD-10-CM

## 2016-12-03 DIAGNOSIS — K219 Gastro-esophageal reflux disease without esophagitis: Secondary | ICD-10-CM | POA: Insufficient documentation

## 2016-12-03 DIAGNOSIS — R87810 Cervical high risk human papillomavirus (HPV) DNA test positive: Secondary | ICD-10-CM

## 2016-12-03 DIAGNOSIS — O039 Complete or unspecified spontaneous abortion without complication: Secondary | ICD-10-CM

## 2016-12-03 MED ORDER — OMEPRAZOLE MAGNESIUM 20 MG PO TBEC: 20.0000 mg | DELAYED_RELEASE_TABLET | Freq: Every day | ORAL | 0 refills | Status: DC

## 2016-12-03 NOTE — Progress Notes (Signed)
   CC: abdominal pain  HPI:  [name] is a 41 y.o. who presents today for abdominal pain. Parts of the interview were conducted in Spanish without the aid of the interpreter. Please see assessment & plan for status of chronic medical problems.  Past Medical History:  Diagnosis Date  . GERD (gastroesophageal reflux disease)   . Low back pain     Review of Systems:  Please see each problem below for a pertinent review of systems.  Physical Exam:  Vitals:   12/03/16 1524  BP: (!) 94/53  Pulse: 66  Temp: 98 F (36.7 C)  TempSrc: Oral  SpO2: 100%  Weight: 149 lb 1.6 oz (67.6 kg)  Height: 5\' 1"  (1.549 m)   Physical Exam  Constitutional: She is oriented to person, place, and time. No distress.  HENT:  Head: Normocephalic and atraumatic.  Eyes: Conjunctivae are normal. No scleral icterus.  Pulmonary/Chest: Effort normal. No respiratory distress.  Abdominal: Soft. She exhibits no distension. There is tenderness (Mild pain elicited with palpation of the RUQ). There is no rebound and no guarding.  Neurological: She is alert and oriented to person, place, and time.  Skin: Skin is warm and dry. She is not diaphoretic.    Assessment & Plan:   See Encounters Tab for problem based charting.  Patient discussed with Dr. Angelia Mould

## 2016-12-03 NOTE — Assessment & Plan Note (Signed)
Assessment She no longer has cramps at night despite having a ferritin <70.  Plan -Reassess at follow-up

## 2016-12-03 NOTE — Assessment & Plan Note (Signed)
Assessment She would like to be referred to a specialist for her pregnancies. She has been pregnant four times in her life resulting in two sons born by natural delivery now aged 43 and 72 and the other two resulting in abortion in 2016 and 2017. She is still menstruating with last period on 5/8.   Plan -Refer to ob/gyn for high-risk obstetric assessment

## 2016-12-03 NOTE — Patient Instructions (Signed)
Por favor, tome 1 tableta 30 minutos antes de comer diariamente durante cuatro semanas.  Le llamaremos sobre su cita con el obstetra y [place].  Por favor, vuelve a verme en 1 mes.

## 2016-12-03 NOTE — Assessment & Plan Note (Addendum)
Assessment After lunch for the past week, she experiences sharp pain in the RUQ that persists throughout the rest of the day until bedtime. Upon awakening the next morning, she has no symptoms. She has not tried any medications nor can she identify alleviating or exacerbating factors. She denies any changes to her lunch and has reduced her consumption of fatty foods. Though she denies any fever, chills, nausea, vomiting, diarrhea, she has felt bloated and full. She recalls similar symptoms in the past, most recently 3 months ago during which her symptoms persisted for a little over a week before resolving altogether.  Her physical exam findings suggest gallbladder disease though PUD and GERD cannot be excluded. Per review of the chart, she had an abdominal US in 2014 without findings of gallbladder disease, and EGD several months later noted non-specific distal gastritis.   Plan -Try empiric trial of omeprazole 20 mg 30 minutes before mealtime daily x 4 weeks -Reorder RUQ [name] to reassess for gallstones  -Check CMET, CBC, lipid profile to assess for hepatic disease -Follow-up in 1 month  ADDENDUM 12/06/2016  5:46 PM:  CMET without any abnormal LFT, electrolytes, renal function. CBC with stable blood counts.HIV negative. Lipid panel without dyslipidemia and unable to calculate 10-year ASCVD risk since total cholesterol <130. Given how concerned she was about her symptoms, I will attempt to call her with results.  ADDENDUM 12/07/2016  9:15 AM:  I spoke with her over the phone in [place] and reviewed her results. She started omeprazole on Sunday and has had some improvement in her pain. She wondered when she could have her [name] and referral, and I told her she should receive a call from someone in our office within a week.   ADDENDUM 12/20/2016  4:18 PM:  No gallstones identified on RUQ [name] with abnormally increased echogenicity suggestive of possible hepatic steatosis. LFTs were reassuring for no acute  causes. Functional gallbladder disorder remains on the differential though it is a diagnosis of exclusion. I will recommend to her to complete 8-week course of PPI therapy to eliminate GERD as a possibility. If she is still having pain at follow-up in 2 weeks, we will check amylase/lipase and consider GI referral for gallbladder function testing.  I spoke to her by phone today, and she feels her pain continues to improve. She has been taking omeprazole before at least 1 meal daily though sometimes more, and I told her she could just try 1 meal, preferably lunch, given her symptoms are mainly in the afternoon. I also encouraged her to note down foods she has to see what might be setting her off. I will also prepare some information on GERD in Spanish for her at follow-up.

## 2016-12-03 NOTE — Assessment & Plan Note (Signed)
Assessment She is overdue for pap smear though politely declined today.  Plan -Reassess at follow-up

## 2016-12-04 LAB — LIPID PANEL
CHOL/HDL RATIO: 2.4 ratio (ref 0.0–4.4)
CHOLESTEROL TOTAL: 116 mg/dL (ref 100–199)
HDL: 48 mg/dL (ref >39)
LDL Calculated: 51 mg/dL (ref 0–99)
Triglycerides: 86 mg/dL (ref 0–149)
VLDL CHOLESTEROL CAL: 17 mg/dL (ref 5–40)

## 2016-12-04 LAB — CBC
HEMOGLOBIN: 12.6 g/dL (ref 11.1–15.9)
Hematocrit: 37.3 % (ref 34.0–46.6)
MCH: 31.3 pg (ref 26.6–33.0)
MCHC: 33.8 g/dL (ref 31.5–35.7)
MCV: 93 fL (ref 79–97)
Platelets: 166 10*3/uL (ref 150–379)
RBC: 4.02 x10E6/uL (ref 3.77–5.28)
RDW: 13.6 % (ref 12.3–15.4)
WBC: 6.6 10*3/uL (ref 3.4–10.8)

## 2016-12-04 LAB — CMP14 + ANION GAP
ALK PHOS: 83 IU/L (ref 39–117)
ALT: 23 IU/L (ref 0–32)
AST: 30 IU/L (ref 0–40)
Albumin/Globulin Ratio: 1.4 (ref 1.2–2.2)
Albumin: 3.9 g/dL (ref 3.5–5.5)
Anion Gap: 15 mmol/L (ref 10.0–18.0)
BUN / CREAT RATIO: 15 (ref 9–23)
BUN: 10 mg/dL (ref 6–24)
Bilirubin Total: 0.2 mg/dL (ref 0.0–1.2)
CALCIUM: 8.9 mg/dL (ref 8.7–10.2)
CO2: 25 mmol/L (ref 18–29)
Chloride: 103 mmol/L (ref 96–106)
Creatinine, Ser: 0.67 mg/dL (ref 0.57–1.00)
GFR calc non Af Amer: 109 mL/min/{1.73_m2} (ref >59)
GFR, EST AFRICAN AMERICAN: 125 mL/min/{1.73_m2} (ref >59)
GLOBULIN, TOTAL: 2.8 g/dL (ref 1.5–4.5)
Glucose: 100 mg/dL — ABNORMAL HIGH (ref 65–99)
Potassium: 4.3 mmol/L (ref 3.5–5.2)
SODIUM: 143 mmol/L (ref 134–144)
TOTAL PROTEIN: 6.7 g/dL (ref 6.0–8.5)

## 2016-12-04 LAB — HIV ANTIBODY (ROUTINE TESTING W REFLEX): HIV SCREEN 4TH GENERATION: NONREACTIVE

## 2016-12-04 NOTE — Progress Notes (Signed)
Internal Medicine Clinic Attending  Case discussed with Dr. Patel,Rushil at the time of the visit.  We reviewed the resident's history and exam and pertinent patient test results.  I agree with the assessment, diagnosis, and plan of care documented in the resident's note.  

## 2016-12-15 ENCOUNTER — Encounter: Payer: Self-pay | Admitting: Internal Medicine

## 2016-12-15 ENCOUNTER — Ambulatory Visit (INDEPENDENT_AMBULATORY_CARE_PROVIDER_SITE_OTHER): Payer: BLUE CROSS/BLUE SHIELD | Admitting: Internal Medicine

## 2016-12-15 VITALS — BP 101/52 | HR 61 | Temp 98.4°F | Ht 61.0 in | Wt 146.8 lb

## 2016-12-15 DIAGNOSIS — O039 Complete or unspecified spontaneous abortion without complication: Secondary | ICD-10-CM | POA: Diagnosis not present

## 2016-12-15 DIAGNOSIS — R197 Diarrhea, unspecified: Secondary | ICD-10-CM | POA: Diagnosis not present

## 2016-12-15 LAB — POCT URINE PREGNANCY: PREG TEST UR: NEGATIVE

## 2016-12-15 NOTE — Progress Notes (Signed)
   CC: abdominal pain  HPI:  Kelly Atkinson is a 41 y.o. who presents today for abdominal pain. Please see assessment & plan for status of chronic medical problems.   Past Medical History:  Diagnosis Date  . GERD (gastroesophageal reflux disease)   . Low back pain     Review of Systems:  Please see each problem below for a pertinent review of systems.  Physical Exam:  Vitals:   12/15/16 1533  BP: (!) 101/52  Pulse: 61  Temp: 98.4 F (36.9 C)  TempSrc: Oral  SpO2: 100%  Weight: 146 lb 12.8 oz (66.6 kg)  Height: 5\' 1"  (1.549 m)   Physical Exam  Constitutional: No distress.  HENT:  Head: Normocephalic and atraumatic.  Eyes: Conjunctivae are normal. No scleral icterus.  Pulmonary/Chest: Effort normal. No respiratory distress.  Abdominal: Soft. Bowel sounds are normal. She exhibits no distension and no mass. There is no tenderness. There is no rebound and no guarding.  Skin: She is not diaphoretic.   Assessment & Plan:   See Encounters Tab for problem based charting.  Patient discussed with Dr. Angelia Mould

## 2016-12-15 NOTE — Assessment & Plan Note (Addendum)
Assessment She has received paperwork from the OB/GYN office and is interested in being checked for pregnancy today.  Plan Check UPT  ADDENDUM 12/15/2016  4:42 PM:  UPT negative.  ADDENDUM 12/16/2016  4:54 PM:  I relayed the results to the patient in [place], and she confirmed understanding.

## 2016-12-15 NOTE — Patient Instructions (Signed)
Te llamar sobre tu prueba de embarazo en la orina.   Por favor complete su documentacin para OB / GYN   Intoxicacin por alimentos (Food Poisoning) QU ES LA INTOXICACIN POR ALIMENTOS? La intoxicacin por alimentos es una enfermedad que es causada por ingerir o tomar alimentos o bebidas contaminados. La intoxicacin por alimentos generalmente es leve y dura 1 o 2 das. Los alimentos y las bebidas se pueden contaminar debido a lo siguiente:  Falta de higiene personal, como por ejemplo no lavarse bien las manos o con poca frecuencia.  Almacenamiento inadecuado de los alimentos. Por ejemplo, no refrigerar la carne cruda.  Servir, preparar y Financial controller los alimentos en superficies que no estn limpias.  Cocinar o comer con utensilios que no estn limpios. Las causas ms frecuentes para esta afeccin son las siguientes:  Virus.  Bacterias.  Parsitos. [place]? Comida y bebida  Beba suficiente lquido para mantener el pis (orina) claro o de color amarillo plido. Beba pequeas cantidades de lquidos claros con frecuencia.  Evite lo siguiente:  [place]eche.  Cafena.  Alcohol.  Consulte con el mdico cmo debe tomar la suficiente cantidad de lquidos para el organismo Metallurgist).  Consuma frecuentemente porciones ms pequeas de comida, en lugar de porciones grandes. Medicamentos  [employer] de venta libre y los recetados solamente como se lo haya indicado el mdico. Consulte a su mdico si puede seguir tomando sus medicamentos recetados o de venta [place].  Si le recetaron un antibitico, tmelo como se lo haya indicado el mdico. No deje de tomar los antibiticos aunque comience a Sports administrator. Instrucciones generales  Lvese bien las manos antes de preparar alimentos y despus de ir al bao (usar el inodoro). Asegrese de [employer] personas con las que convive se laven las manos con frecuencia.  Limpie todas las superficies que toca con un  producto que contenga blanqueador con cloro.  Concurra a todas las visitas de control como se lo haya indicado el mdico. Esto es importante. [place] DE LOS ALIMENTOS?  Lvese las manos, limpie los utensilios y las superficies de preparacin de los alimentos antes y despus de manipular alimentos crudos.  Utilice superficies de preparacin de alimentos y espacios de almacenamiento por separado para carnes crudas, y para frutas y vegetales.  Mantenga los alimentos refrigerados por debajo de 46F (5C).  Sirva los alimentos calientes inmediatamente o mantngalos a una temperatura superior a los 146F (60C).  Almacene los alimentos secos en espacios frescos y secos. Mantngalos alejados del calor o la humedad excesivos.  Deseche cualquier alimento que:  No huela bien.  Est en latas abombadas.  Siga los procedimientos aprobados para preparar conservas.  Cocine bien los alimentos en latas de conserva antes de probarlos.  Beba agua embotellada o esterilizada cuando viaje. [place]? Llame al 911 o dirjase a la sala de emergencia si:  Tiene dificultad para hacer lo siguiente:  Respirar.  Tragar.  Hablar.  Moverse.  Tiene la visin borrosa.  No puede comer ni beber sin vomitar.  Pierde el conocimiento (se desmaya).  Los ojos [employer].  Contina con diarrea o vmitos.  Comienza a Child psychotherapist (abdomen), el dolor en el estmago empeora o el dolor se concentra en una zona pequea.  Tiene fiebre.  Tiene sangre o mucosidad en la materia fecal (heces) o es de color negro y de aspecto alquitranado.  Tiene signos de deshidratacin,  por ejemplo:  La orina es [place], es muy escasa o no [place].  Los labios estn agrietados.  No hay lgrimas cuando llora.  Sequedad en la boca.  Ojos hundidos.  Somnolencia.  Debilidad.  Mareos. Esta informacin no tiene Buyer, retail el consejo del mdico. Asegrese de hacerle al mdico cualquier pregunta que tenga. Document Released: 08/07/2010 Document Revised: 10/27/2015 Document Reviewed: 01/06/2015 Elsevier Interactive Patient Education  2017 [employer].

## 2016-12-15 NOTE — Assessment & Plan Note (Deleted)
Assessment She would like to be checked for pregnancy today.  Plan Check UPT  ADDENDUM 12/15/2016  4:42 PM:  UPT negative.

## 2016-12-15 NOTE — Assessment & Plan Note (Signed)
Assessment On the evening of 12/11/16, she ate egg rolls and shrimp but developed chills, subjective fever, body aches, diarrhea, and abdominal pain the following day. Acetaminophen relieved her aches and chills but didn't help her pain. Her symptoms have improved over the last few days with her pain being infrequent and minimal at best. She is tolerating solid and liquids well. Her BP is at baseline, and she is not tachycardic to suggest dehydration.   Plan -Reassured her she had food poisoning which precipitated her symptoms and expect to improve. I did not recommend anti motility agents as it may inhibit her body's ability to eliminate toxin.

## 2016-12-17 ENCOUNTER — Ambulatory Visit (HOSPITAL_COMMUNITY)
Admission: RE | Admit: 2016-12-17 | Discharge: 2016-12-17 | Disposition: A | Discharge status: Home / Self Care | Payer: BLUE CROSS/BLUE SHIELD | Source: Ambulatory Visit | Attending: Internal Medicine | Admitting: Internal Medicine

## 2016-12-17 DIAGNOSIS — R932 Abnormal findings on diagnostic imaging of liver and biliary tract: Secondary | ICD-10-CM | POA: Insufficient documentation

## 2016-12-17 DIAGNOSIS — R1011 Right upper quadrant pain: Secondary | ICD-10-CM | POA: Diagnosis not present

## 2016-12-20 ENCOUNTER — Encounter: Payer: Self-pay | Admitting: *Deleted

## 2016-12-20 NOTE — Progress Notes (Signed)
Internal Medicine Clinic Attending  Case discussed with Dr. Patel,Rushil at the time of the visit.  We reviewed the resident's history and exam and pertinent patient test results.  I agree with the assessment, diagnosis, and plan of care documented in the resident's note.  

## 2016-12-27 DIAGNOSIS — Z3169 Encounter for other general counseling and advice on procreation: Secondary | ICD-10-CM | POA: Diagnosis not present

## 2016-12-27 DIAGNOSIS — Z124 Encounter for screening for malignant neoplasm of cervix: Secondary | ICD-10-CM | POA: Diagnosis not present

## 2016-12-27 DIAGNOSIS — Z6827 Body mass index (BMI) 27.0-27.9, adult: Secondary | ICD-10-CM | POA: Diagnosis not present

## 2016-12-27 DIAGNOSIS — O2620 Pregnancy care for patient with recurrent pregnancy loss, unspecified trimester: Secondary | ICD-10-CM | POA: Diagnosis not present

## 2016-12-27 DIAGNOSIS — Z3149 Encounter for other procreative investigation and testing: Secondary | ICD-10-CM | POA: Diagnosis not present

## 2016-12-27 DIAGNOSIS — Z131 Encounter for screening for diabetes mellitus: Secondary | ICD-10-CM | POA: Diagnosis not present

## 2016-12-27 DIAGNOSIS — Z1151 Encounter for screening for human papillomavirus (HPV): Secondary | ICD-10-CM | POA: Diagnosis not present

## 2016-12-27 DIAGNOSIS — B977 Papillomavirus as the cause of diseases classified elsewhere: Secondary | ICD-10-CM | POA: Diagnosis not present

## 2016-12-27 DIAGNOSIS — N96 Recurrent pregnancy loss: Secondary | ICD-10-CM | POA: Diagnosis not present

## 2017-01-05 ENCOUNTER — Encounter: Payer: Self-pay | Admitting: Internal Medicine

## 2017-01-05 ENCOUNTER — Ambulatory Visit (INDEPENDENT_AMBULATORY_CARE_PROVIDER_SITE_OTHER): Payer: BLUE CROSS/BLUE SHIELD | Admitting: Internal Medicine

## 2017-01-05 VITALS — BP 99/53 | HR 66 | Temp 98.2°F | Ht 61.0 in | Wt 147.7 lb

## 2017-01-05 DIAGNOSIS — K219 Gastro-esophageal reflux disease without esophagitis: Secondary | ICD-10-CM

## 2017-01-05 DIAGNOSIS — R7303 Prediabetes: Secondary | ICD-10-CM | POA: Insufficient documentation

## 2017-01-05 DIAGNOSIS — J309 Allergic rhinitis, unspecified: Secondary | ICD-10-CM | POA: Diagnosis not present

## 2017-01-05 DIAGNOSIS — J3089 Other allergic rhinitis: Secondary | ICD-10-CM

## 2017-01-05 LAB — GLUCOSE, CAPILLARY: Glucose-Capillary: 109 mg/dL — ABNORMAL HIGH (ref 65–99)

## 2017-01-05 LAB — POCT GLYCOSYLATED HEMOGLOBIN (HGB A1C): Hemoglobin A1C: 5.8

## 2017-01-05 MED ORDER — FLUTICASONE PROPIONATE 50 MCG/ACT NA SUSP: 1.0000 | Freq: Every day | NASAL | 1 refills | Status: DC

## 2017-01-05 NOTE — Progress Notes (Signed)
   CC: headache  HPI:  [name] is a 41 y.o. who presents today for headache. Please see assessment & plan for status of chronic medical problems.   Past Medical History:  Diagnosis Date  . GERD (gastroesophageal reflux disease)   . Low back pain     Review of Systems:  Please see each problem below for a pertinent review of systems.  Physical Exam:  Vitals:   01/05/17 1347  BP: (!) 99/53  Pulse: 66  Temp: 98.2 F (36.8 C)  TempSrc: Oral  SpO2: 100%  Weight: 147 lb 11.2 oz (67 kg)  Height: 5\' 1"  (1.549 m)   Physical Exam  Constitutional: She is oriented to person, place, and time. No distress.  HENT:  Head: Normocephalic and atraumatic.  No tenderness to palpation overlying frontal and maxillary sinuses. No streaking noted over the posterior oropharynx.  Eyes: Conjunctivae are normal. No scleral icterus.  Pulmonary/Chest: Effort normal. No respiratory distress.  Neurological: She is alert and oriented to person, place, and time.  Skin: She is not diaphoretic.    Assessment & Plan:   See Encounters Tab for problem based charting.  Patient discussed with Dr. Daryll Drown

## 2017-01-05 NOTE — Patient Instructions (Addendum)
Para la congestin, use el aerosol una vez por orificio nasal antes de acostarse.  Fue un privilegio cuidar de ti y te deseo lo mejor.

## 2017-01-06 NOTE — Assessment & Plan Note (Signed)
Assessment She recently saw Dr. Pamala Hurry [OB-GYN] who initiated metformin therapy. She was told she had diabetes over the phone and wonders if there was a miscommunication. She denies dry mouth, polyuria, weight loss and feels she exercises the best dietary discretion in her family.   Plan -Check A1c today. A1c 5.8 which is prediabetic.  -Reviewed with her the diagnosis of pre-diabetes using handouts in Spanish and emphasized she did not have diabetes and that this medication may improve her ability to become pregnant by improving glycemic control.

## 2017-01-06 NOTE — Assessment & Plan Note (Signed)
Assessment Her RUQ pain has improved and is now intermittent. She is not interested in refilling omeprazole at this time.  Plan -Continue assessing.

## 2017-01-06 NOTE — Assessment & Plan Note (Signed)
Assessment Over the last 1-2 weeks, she wakes up with some headache and nasal congestion which improves as the day progresses and with some OTC cold medication. She denies any otorrhea, rhinorrhea, fever, chills, dizziness, changes in appetite, ear pain, changes in sleep prior history of seasonal allergies. I wonder if she has allergic rhinitis which may be amenable to intranasal corticosteroid therapy.  Plan -Prescribed fluticasone nasal spray 1-2 sprays/nostril daily. Reviewed proper inhaler technique in Spanish.

## 2017-01-09 NOTE — Progress Notes (Signed)
Internal Medicine Clinic Attending  Case discussed with Dr. Patel,Rushil at the time of the visit.  We reviewed the resident's history and exam and pertinent patient test results.  I agree with the assessment, diagnosis, and plan of care documented in the resident's note.  

## 2017-02-03 DIAGNOSIS — B977 Papillomavirus as the cause of diseases classified elsewhere: Secondary | ICD-10-CM | POA: Diagnosis not present

## 2017-02-03 DIAGNOSIS — Z3200 Encounter for pregnancy test, result unknown: Secondary | ICD-10-CM | POA: Diagnosis not present

## 2017-02-03 DIAGNOSIS — N871 Moderate cervical dysplasia: Secondary | ICD-10-CM | POA: Diagnosis not present

## 2017-03-04 DIAGNOSIS — Z3200 Encounter for pregnancy test, result unknown: Secondary | ICD-10-CM | POA: Diagnosis not present

## 2017-03-04 DIAGNOSIS — N871 Moderate cervical dysplasia: Secondary | ICD-10-CM | POA: Diagnosis not present

## 2017-03-15 DIAGNOSIS — N871 Moderate cervical dysplasia: Secondary | ICD-10-CM | POA: Diagnosis not present

## 2017-05-24 DIAGNOSIS — Z3202 Encounter for pregnancy test, result negative: Secondary | ICD-10-CM | POA: Diagnosis not present

## 2017-06-15 DIAGNOSIS — Z3169 Encounter for other general counseling and advice on procreation: Secondary | ICD-10-CM | POA: Diagnosis not present

## 2017-06-16 ENCOUNTER — Ambulatory Visit: Payer: BLUE CROSS/BLUE SHIELD

## 2017-06-16 ENCOUNTER — Other Ambulatory Visit: Payer: Self-pay

## 2017-06-16 ENCOUNTER — Ambulatory Visit (INDEPENDENT_AMBULATORY_CARE_PROVIDER_SITE_OTHER): Payer: BLUE CROSS/BLUE SHIELD | Admitting: Internal Medicine

## 2017-06-16 VITALS — BP 106/56 | HR 70 | Temp 97.8°F | Ht 63.0 in | Wt 139.0 lb

## 2017-06-16 DIAGNOSIS — Z23 Encounter for immunization: Secondary | ICD-10-CM

## 2017-06-16 DIAGNOSIS — K219 Gastro-esophageal reflux disease without esophagitis: Secondary | ICD-10-CM | POA: Diagnosis not present

## 2017-06-16 DIAGNOSIS — R7303 Prediabetes: Secondary | ICD-10-CM | POA: Diagnosis not present

## 2017-06-16 DIAGNOSIS — G44209 Tension-type headache, unspecified, not intractable: Secondary | ICD-10-CM

## 2017-06-16 LAB — POCT GLYCOSYLATED HEMOGLOBIN (HGB A1C): HEMOGLOBIN A1C: 5.8

## 2017-06-16 LAB — GLUCOSE, CAPILLARY: Glucose-Capillary: 105 mg/dL — ABNORMAL HIGH (ref 65–99)

## 2017-06-16 MED ORDER — OMEPRAZOLE MAGNESIUM 20 MG PO TBEC: 20.0000 mg | DELAYED_RELEASE_TABLET | Freq: Every day | ORAL | 0 refills | Status: DC

## 2017-06-16 NOTE — Patient Instructions (Addendum)
[name],  It was nice meeting you today. I am sorry you are having headaches. They are most likely what we call tension headaches. You can try taking 1000 mg of Tylenol every 8 hours as needed for the headaches. Try this for a week and if the headaches are not getting any better please come back and see [name].  Your A1c is very well controlled at 5.8. I do not think we need to continue the metformin. Lets stop it and continue to work on your diet and lifestyle changes for the pre-diabetes.   FOLLOW-UP INSTRUCTIONS When: 1 week if needed for headaches; 07/29/17 with Dr. Maricela Bo for routine follow up What to bring: Your medications

## 2017-06-16 NOTE — Progress Notes (Signed)
   CC: Headaches  HPI:  Kelly Atkinson is a 41 y.o. female with a past medical history listed below here today with complaints of headache.  She reports new onset headache for the past week. Notes a front headache that migrates between left sided and right sided nearly constant for the past week. Describes it as an achy throbbing pain that is 3/10. States she has been taking Tylenol 250 mg daily with some relief of the symptoms but has never had complete resolution of the headache. Does note phonophobia with the headache however denies any nausea, vomiting, photophobia, or visual disturbances. Denies any lacrimation, rhinorrhea, restlessness/agitation, or focal neurologic deficits. Reports no previous history of headaches.   Past Medical History:  Diagnosis Date  . GERD (gastroesophageal reflux disease)   . Low back pain    Review of Systems:   Negative except as noted in HPI  Physical Exam:  Vitals:   06/16/17 1348  BP: (!) 106/56  Pulse: 70  Temp: 97.8 F (36.6 C)  TempSrc: Oral  SpO2: 99%  Weight: 139 lb (63 kg)  Height: 5\' 3"  (1.6 m)   Physical Exam  Constitutional: She is oriented to person, place, and time and well-developed, well-nourished, and in no distress. No distress.  HENT:  Head: Normocephalic and atraumatic.  Eyes: Pupils are equal, round, and reactive to light.  Cardiovascular: Normal rate, regular rhythm and normal heart sounds.  Pulmonary/Chest: Effort normal and breath sounds normal. No respiratory distress. She has no wheezes.  Abdominal: Soft. Bowel sounds are normal. She exhibits no distension. There is no tenderness.  Neurological: She is alert and oriented to person, place, and time. She has normal sensation, normal strength, normal reflexes and intact cranial nerves.  Skin: Skin is warm and dry.  Psychiatric: Mood and affect normal.  Vitals reviewed.  Assessment & Plan:   See Encounters Tab for problem based charting.  Patient  discussed with Dr. Eppie Gibson

## 2017-06-17 DIAGNOSIS — R51 Headache: Secondary | ICD-10-CM

## 2017-06-17 DIAGNOSIS — R519 Headache, unspecified: Secondary | ICD-10-CM | POA: Insufficient documentation

## 2017-06-17 NOTE — Assessment & Plan Note (Signed)
Lab Results  Component Value Date   HGBA1C 5.8 06/16/2017   HGBA1C 5.8 01/05/2017    A1c 5.8 today. Denies any polyuria or polydipsia today. She is currently taking metformin (though not on her current med list). Has 8 lb intentional weight loss with lifestyle modifications.   A/P: Discontinue Metformin today. Continue lifestyle modifications. Follow A1c annually.

## 2017-06-17 NOTE — Progress Notes (Signed)
Case discussed with Dr. Boswell at the time of the visit.  We reviewed the resident's history and exam and pertinent patient test results.  I agree with the assessment, diagnosis and plan of care documented in the resident's note. 

## 2017-06-17 NOTE — Assessment & Plan Note (Signed)
Requesting refill for Prilosec. Refilled today.

## 2017-06-17 NOTE — Assessment & Plan Note (Signed)
Patient with new onset headache. No alarm symptoms with tension type headache description. No focal neurologic deficits on examination. Patient is less than 50 so would not pursue head imaging at this time.   Will treat with course of Tylenol 1000 mg q8hr prn. If no improvement in 1 week, RTC for further evaluation and consideration of additional work up including imaging.

## 2017-07-28 NOTE — Progress Notes (Signed)
   CC: Lower abdominal pain  HPI:  [name] is a 42 y.o. with pmh of gerd, allergic rhinitis, and herpes simplex labialis presented to follow up regarding her lower abdominal pain. Please see problem based charting for evaluation, assessment, and plan.  Past Medical History:  Diagnosis Date  . GERD (gastroesophageal reflux disease)   . Low back pain    Review of Systems:  The patient denies headaches, sob, chest pain, weakness, or nausea.   Physical Exam:  There were no vitals filed for this visit.  Physical Exam  Constitutional: She appears well-developed and well-nourished. No distress.  HENT:  Head: Normocephalic and atraumatic.  Eyes: Conjunctivae are normal.  Cardiovascular: Normal rate, regular rhythm, normal heart sounds and intact distal pulses.  Pulmonary/Chest: Effort normal and breath sounds normal. No stridor. No respiratory distress.  Abdominal: Soft. Bowel sounds are normal. She exhibits no distension. There is no tenderness.  Skin: She is not diaphoretic.  Psychiatric: She has a normal mood and affect. Her behavior is normal. Judgment and thought content normal.     Assessment & Plan:   See Encounters Tab for problem based charting.  Patient discussed with Dr. Evette Doffing

## 2017-07-29 ENCOUNTER — Encounter: Payer: Self-pay | Admitting: Internal Medicine

## 2017-07-29 ENCOUNTER — Other Ambulatory Visit: Payer: Self-pay

## 2017-07-29 ENCOUNTER — Ambulatory Visit: Payer: BLUE CROSS/BLUE SHIELD | Admitting: Internal Medicine

## 2017-07-29 VITALS — BP 100/57 | HR 65 | Temp 98.0°F | Ht 63.0 in | Wt 141.9 lb

## 2017-07-29 DIAGNOSIS — O039 Complete or unspecified spontaneous abortion without complication: Secondary | ICD-10-CM

## 2017-07-29 DIAGNOSIS — K219 Gastro-esophageal reflux disease without esophagitis: Secondary | ICD-10-CM

## 2017-07-29 DIAGNOSIS — R1030 Lower abdominal pain, unspecified: Secondary | ICD-10-CM | POA: Diagnosis not present

## 2017-07-29 DIAGNOSIS — B001 Herpesviral vesicular dermatitis: Secondary | ICD-10-CM | POA: Diagnosis not present

## 2017-07-29 DIAGNOSIS — Z8759 Personal history of other complications of pregnancy, childbirth and the puerperium: Secondary | ICD-10-CM | POA: Diagnosis not present

## 2017-07-29 DIAGNOSIS — J309 Allergic rhinitis, unspecified: Secondary | ICD-10-CM

## 2017-07-29 DIAGNOSIS — Z0000 Encounter for general adult medical examination without abnormal findings: Secondary | ICD-10-CM

## 2017-07-29 DIAGNOSIS — Z79899 Other long term (current) drug therapy: Secondary | ICD-10-CM | POA: Diagnosis not present

## 2017-07-29 NOTE — Patient Instructions (Signed)
It was a pleasure to see you today Ms. Silva-Gutierrez. Please make the following changes:  -Please schedule for a pap smear in one month -Please continue to eat a healthy diet and exercise at lease 30 min daily  If you have any questions or concerns, please call our clinic at [phone] between 9am-5pm and after hours call [phone] and ask for the internal medicine resident on call. If you feel you are having a medical emergency please call 911.   Thank you, we look forward to help you remain healthy!  Lars Mage, MD Internal Medicine PGY1

## 2017-07-30 NOTE — Assessment & Plan Note (Signed)
Will return for pap smear.

## 2017-07-30 NOTE — Assessment & Plan Note (Signed)
The patient stated that she has left lower abdominal pain for the past 3 years subsequent to a miscarriage she had in 2015. The patient states that she initially had the pain every day that lasted few hours for 1 month and subsequently she has had the pain intermittently. Now the pain is an achy/pricky type of pain that lasts a few minutes and occurs few times per day 3-4 times a month. The patient's last menstrual period was on 07/11/17. She has missed her period 1-2 times in the past year. The period lasts approximately a week.  The patient was seen by her gynecologist after her miscarriage about the abdominal pain and she was told that there were no abnormalities.   -Instructed the patient to return for pap smear in 1 month

## 2017-07-30 NOTE — Assessment & Plan Note (Signed)
The patient states that she is doing well and only uses omeprazole on an as needed basis.   -continue omeprazole

## 2017-08-01 NOTE — Progress Notes (Signed)
Internal Medicine Clinic Attending  Case discussed with Dr. Chundi at the time of the visit.  We reviewed the resident's history and exam and pertinent patient test results.  I agree with the assessment, diagnosis, and plan of care documented in the resident's note. 

## 2017-12-03 IMAGING — US US ABDOMEN LIMITED
1 series · 14 of 25 positions shown · non-contrast
Comparison: Abdominal ultrasound April 13, 2013

CLINICAL DATA: Right upper quadrant pain for 1 month

EXAM:
US ABDOMEN LIMITED - RIGHT UPPER QUADRANT

[Series 1: us abdomen limited · 0.20mm/px · 14 of 38 slices shown]
[im 1/38]
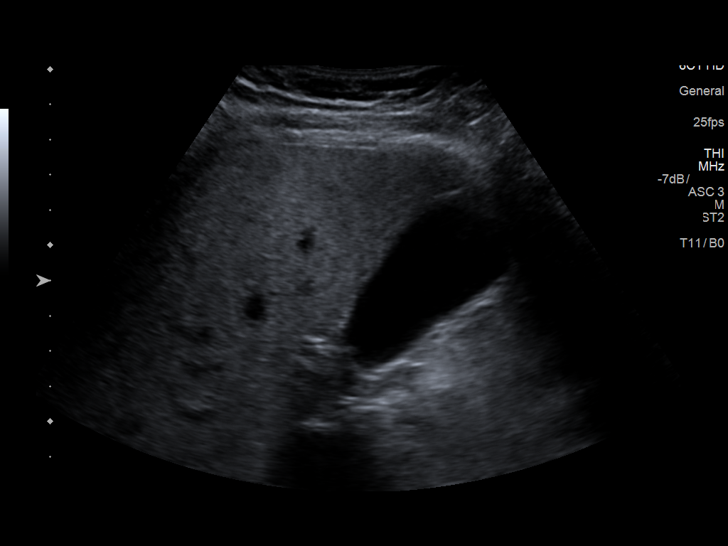
[im 4/38]
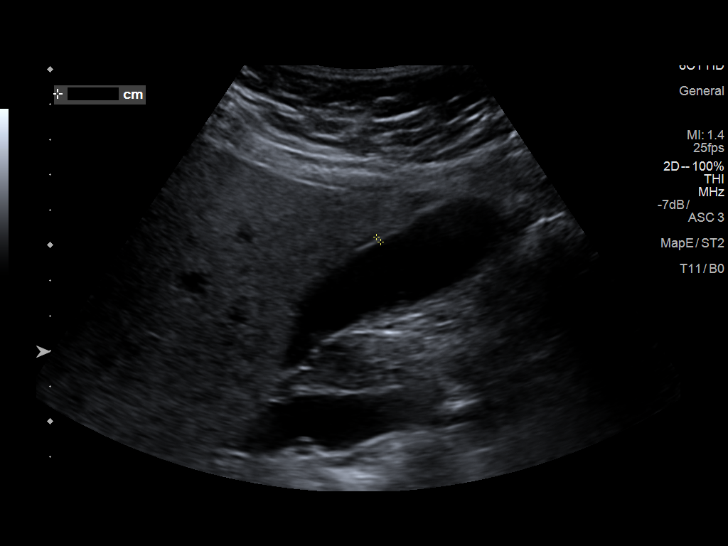
[im 7/38]
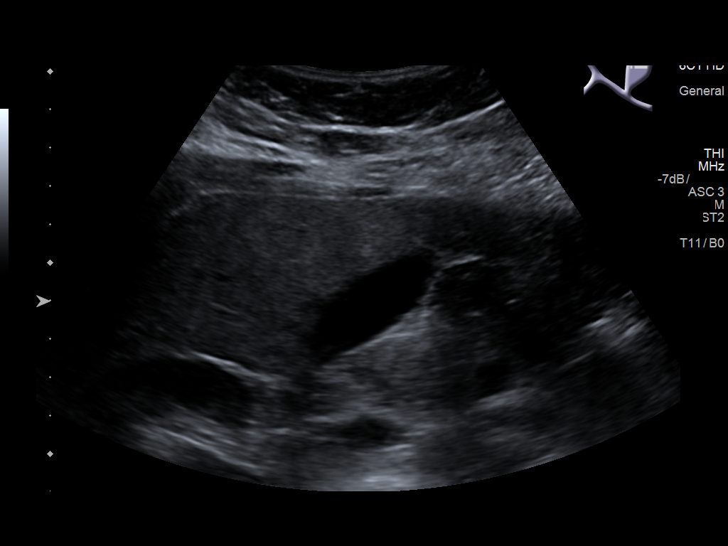
[im 10/38]
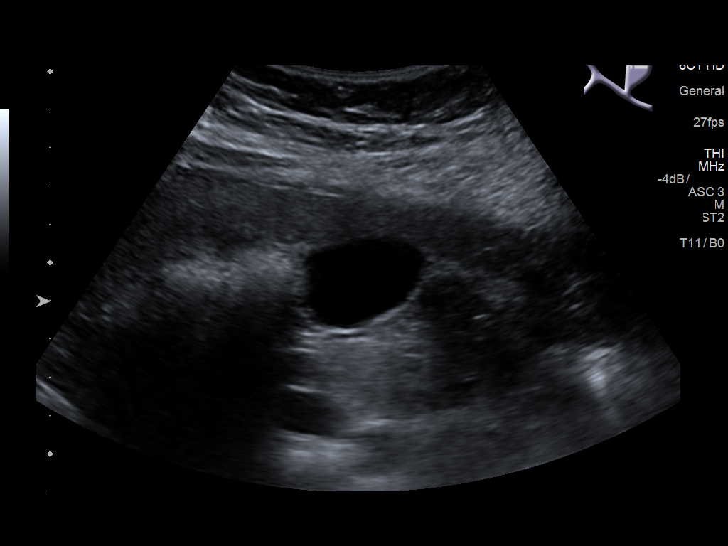
[im 13/38]
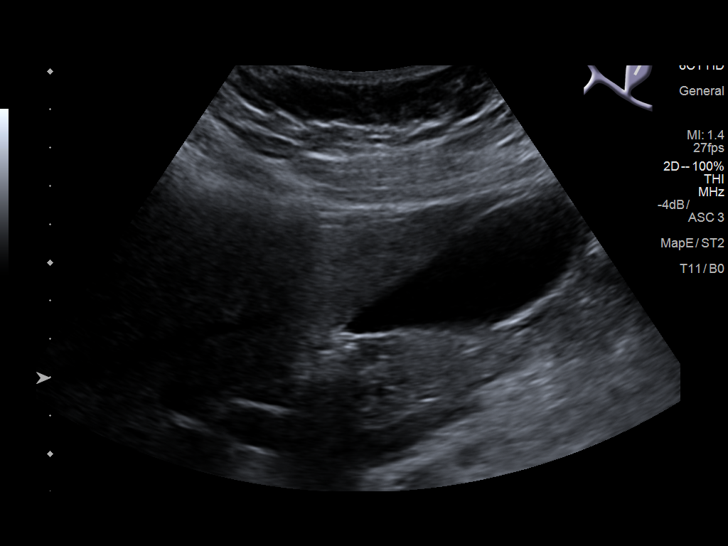
[im 14/38]
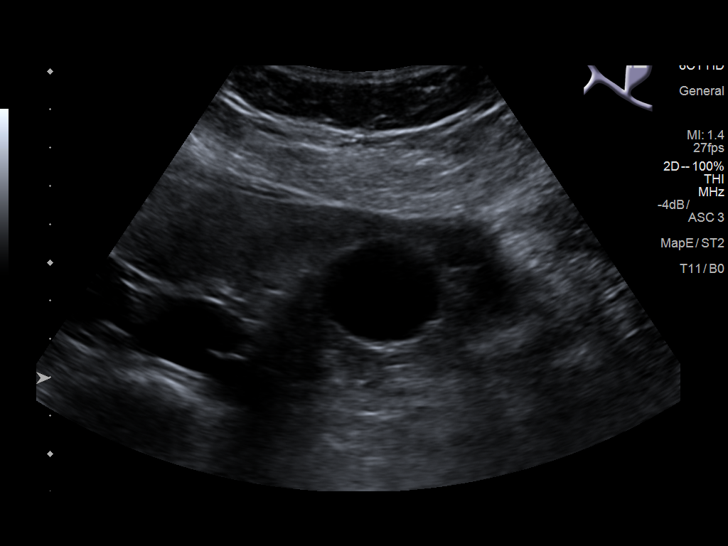
[im 17/38]
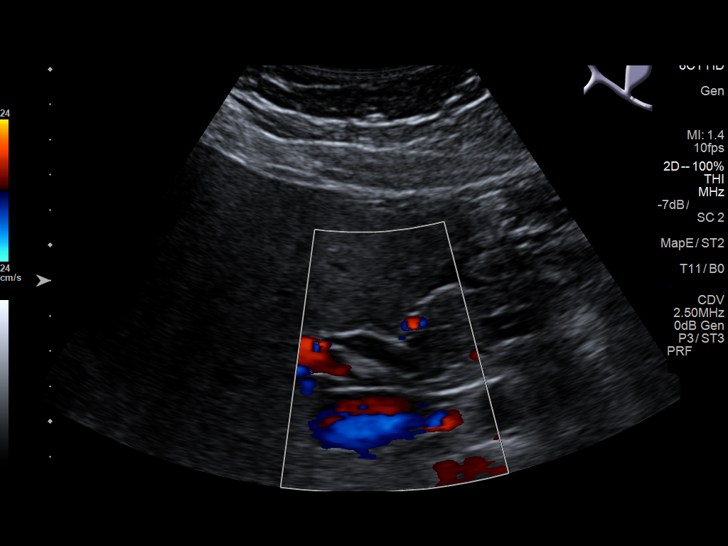
[im 21/38]
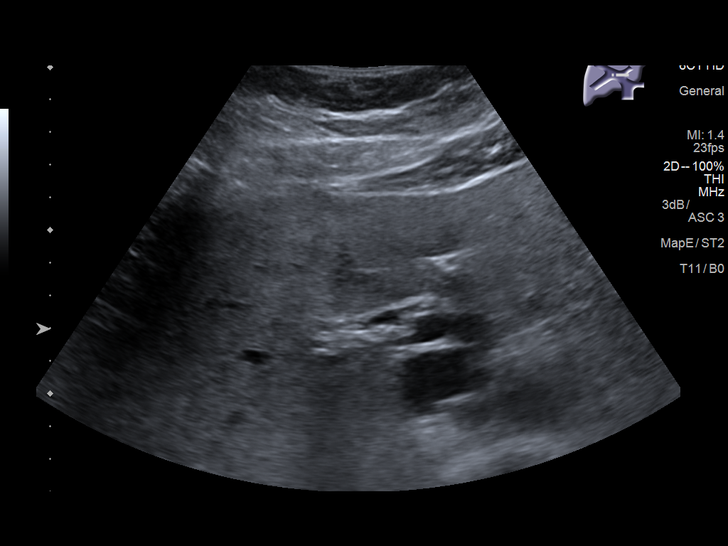
[im 24/38]
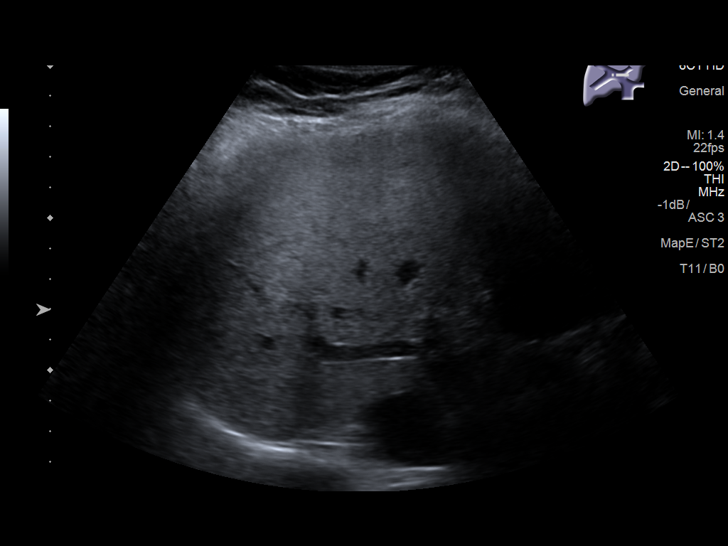
[im 25/38]
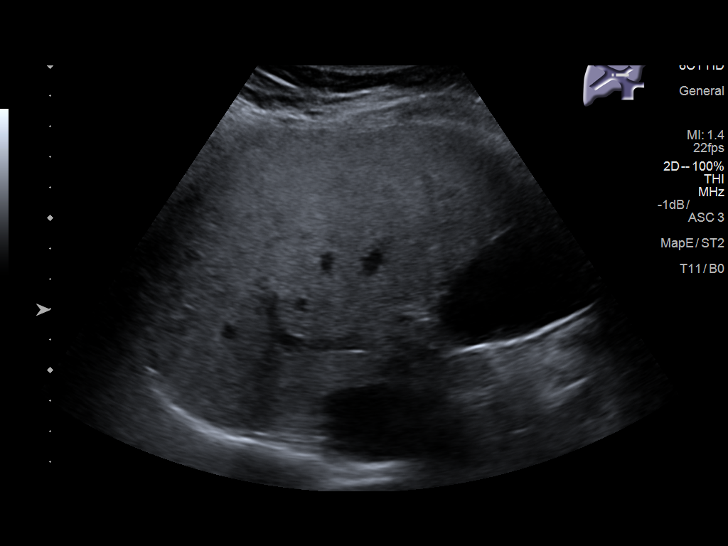
[im 28/38]
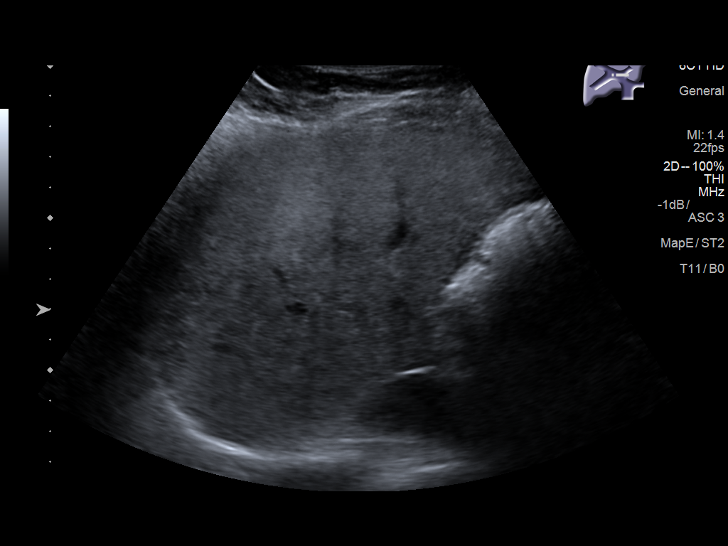
[im 31/38]
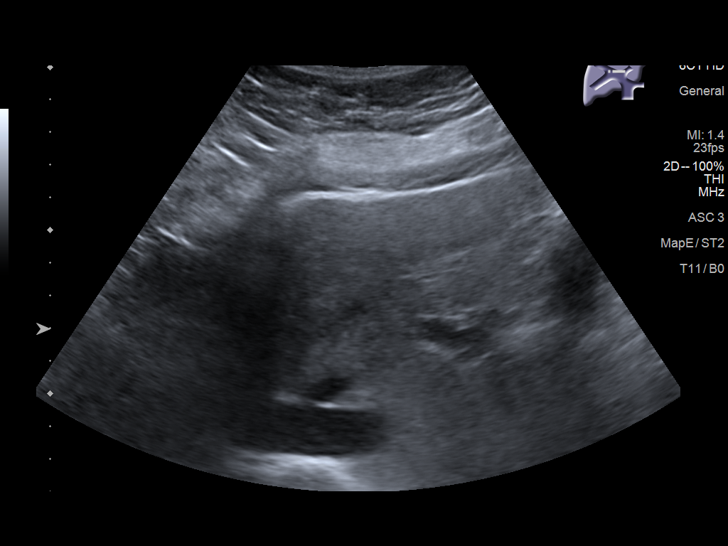
[im 34/38]
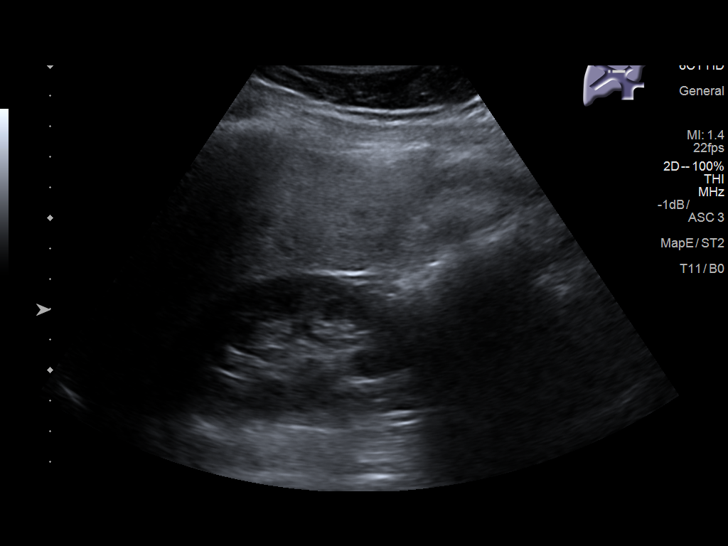
[im 38/38]
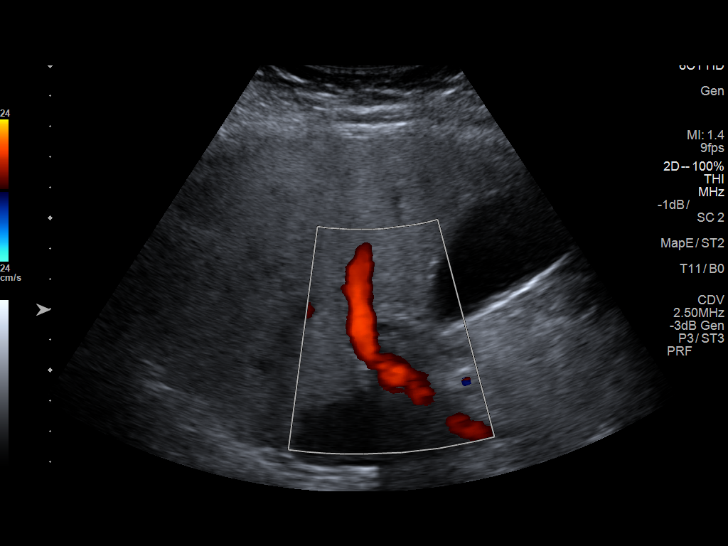

[14 of 25 positions shown; findings below may reference images not displayed]

FINDINGS: Gallbladder:

No gallstones or wall thickening visualized. There is no
pericholecystic fluid. No sonographic Murphy sign noted by
sonographer.

Common bile duct:

Diameter: 5 mm. No intrahepatic or extrahepatic biliary duct
dilatation.

Liver:

No focal lesion identified. Liver echogenicity is overall increased.
IMPRESSION: Increased liver echogenicity, a finding most likely indicative of a
degree of hepatic steatosis. While no focal liver lesions are
evident, it must be cautioned that the sensitivity of ultrasound for
detection of focal liver lesions is diminished in this circumstance.
Study otherwise unremarkable.

## 2018-01-11 NOTE — Progress Notes (Signed)
CC: Pre-diabetes follow up  HPI:  [name] is a 42 y.o. female with gerd, allergic rhinitis, and pre-diabetes who presents to clinic for follow up visit. Please see problem based charting for evaluation, assessment, and plan.  Past Medical History:  Diagnosis Date  . GERD (gastroesophageal reflux disease)   . Low back pain    Review of Systems:    Review of Systems  Constitutional: Positive for malaise/fatigue. Negative for chills, fever and weight loss.  HENT: Negative for congestion.   Eyes: Negative for redness.  Respiratory: Negative for cough and shortness of breath.   Cardiovascular: Negative for chest pain and PND.  Gastrointestinal: Positive for nausea. Negative for abdominal pain, diarrhea and vomiting.  Neurological: Positive for dizziness and headaches.  Psychiatric/Behavioral:       Mood changes   Physical Exam:  Vitals:   01/13/18 1348  BP: (!) 98/49  Pulse: 69  Temp: 98.6 F (37 C)  TempSrc: Oral  SpO2: 99%  Weight: 142 lb 9.6 oz (64.7 kg)  Height: 5\' 3"  (1.6 m)   Physical Exam  Constitutional: She is oriented to person, place, and time. She appears well-developed and well-nourished. No distress.  HENT:  Head: Normocephalic and atraumatic.  Eyes: Conjunctivae are normal.  Cardiovascular: Normal rate, regular rhythm and normal heart sounds.  Respiratory: Effort normal and breath sounds normal. No respiratory distress. She has no wheezes.  GI: Soft. Bowel sounds are normal. She exhibits no distension. There is no tenderness.  Musculoskeletal: She exhibits no edema.  Neurological: She is alert and oriented to person, place, and time.  Skin: No rash noted. She is not diaphoretic. No erythema.  Psychiatric: She has a normal mood and affect. Her behavior is normal. Judgment and thought content normal.    Assessment & Plan:   See Encounters Tab for problem based charting.  Dizziness  Patient's plan she is dizziness this Monday for  routine she described her dizziness as room spinning.  She mentions that she felt like she was going to fall but she did not fall.  Only one episode she findings of dizziness, she mentions that she has a generalized feeling.  Orthostatic vitals were negative (lying-101/53, 68; sitting 98/66, 72; standing 91/55, 74).   She has had a prior episode of dizziness reported in 2015 that was thought to be positional.  Patient reports mood changes, fatigue, and one episode of night sweats per month.  She also states that she has been having irregular bleeding.  She reports that she had a menstrual cycle in January 2019 and subsequently in May 2019.  Her last period was December 22, 2017.  She is currently sexually active with one partner and they do not use contraception.  Patient also reports that she has been having right sided frontal headache that has lasted 1 hour and has been intermittent in nature over the past week.  The headache is not worsened with this, bright lights.  She also does not have any tearing from her eyes.  Assessment: Patient reports fatigue, irregular period, mood changes, and dizziness that cause concern for possible disease.  Urine pregnancy was done as she has been sexually active with one partner without using contraception. The pregnancy test was negative. Will also evaluate for anemia and possible electrolyte abnormalities that maybe contributing to patient's dizziness with cbc and bmp.  Pre-diabetes The patient is pre-diabetic and has historically had a1c in normal range. The patient's last a1c=5.8 in November 2018. The patient is not  currently on any medication. The patient does not note any weight changes.    Assessment and Plan -A1c=5.9 during this visit. Her glucose levels continue to remain in good control. Will recheck in 1 year.  Allergic Rhinitis The patient's allergic rhinitis is well controlled and she does not note any post nasal drip, cough, runny eyes, or shortness of  breath.   Health Maintenance The patient is due for a pap smear however she requested it to be done at next visit.   Patient discussed with Dr. Daryll Drown

## 2018-01-13 ENCOUNTER — Encounter: Payer: Self-pay | Admitting: Internal Medicine

## 2018-01-13 ENCOUNTER — Other Ambulatory Visit: Payer: Self-pay

## 2018-01-13 ENCOUNTER — Ambulatory Visit: Payer: BLUE CROSS/BLUE SHIELD | Admitting: Internal Medicine

## 2018-01-13 VITALS — BP 98/49 | HR 69 | Temp 98.6°F | Ht 63.0 in | Wt 142.6 lb

## 2018-01-13 DIAGNOSIS — J3089 Other allergic rhinitis: Secondary | ICD-10-CM

## 2018-01-13 DIAGNOSIS — N926 Irregular menstruation, unspecified: Secondary | ICD-10-CM

## 2018-01-13 DIAGNOSIS — R110 Nausea: Secondary | ICD-10-CM | POA: Diagnosis not present

## 2018-01-13 DIAGNOSIS — F39 Unspecified mood [affective] disorder: Secondary | ICD-10-CM

## 2018-01-13 DIAGNOSIS — K219 Gastro-esophageal reflux disease without esophagitis: Secondary | ICD-10-CM | POA: Diagnosis not present

## 2018-01-13 DIAGNOSIS — J309 Allergic rhinitis, unspecified: Secondary | ICD-10-CM | POA: Diagnosis not present

## 2018-01-13 DIAGNOSIS — R51 Headache: Secondary | ICD-10-CM

## 2018-01-13 DIAGNOSIS — R7303 Prediabetes: Secondary | ICD-10-CM | POA: Diagnosis not present

## 2018-01-13 DIAGNOSIS — R42 Dizziness and giddiness: Secondary | ICD-10-CM | POA: Diagnosis not present

## 2018-01-13 DIAGNOSIS — Z0000 Encounter for general adult medical examination without abnormal findings: Secondary | ICD-10-CM

## 2018-01-13 LAB — POCT GLYCOSYLATED HEMOGLOBIN (HGB A1C): Hemoglobin A1C: 5.9 % — AB (ref 4.0–5.6)

## 2018-01-13 LAB — POCT URINE PREGNANCY: Preg Test, Ur: NEGATIVE

## 2018-01-13 LAB — GLUCOSE, CAPILLARY: GLUCOSE-CAPILLARY: 116 mg/dL — AB (ref 70–99)

## 2018-01-13 NOTE — Assessment & Plan Note (Signed)
The patient is due for a pap smear however she requested it to be done at next visit.

## 2018-01-13 NOTE — Assessment & Plan Note (Addendum)
Patient's plan she is dizziness this Monday for routine she described her dizziness as room spinning.  She mentions that she felt like she was going to fall but she did not fall.  Only one episode she findings of dizziness, she mentions that she has a generalized feeling.  Orthostatic vitals were negative (lying-101/53, 68; sitting 98/66, 72; standing 91/55, 74).   She has had a prior episode of dizziness reported in 2015 that was thought to be positional.  Patient reports mood changes, fatigue, and one episode of night sweats per month.  She also states that she has been having irregular bleeding.  She reports that she had a menstrual cycle in January 2019 and subsequently in May 2019.  Her last period was December 22, 2017.  She is currently sexually active with one partner and they do not use contraception.  Patient also reports that she has been having right sided frontal headache that has lasted 1 hour and has been intermittent in nature over the past week.  The headache is not worsened with this, bright lights.  She also does not have any tearing from her eyes.  Assessment: Patient reports fatigue, irregular period, mood changes, and dizziness that cause concern for possible disease. Urine pregancy will be done as she has been sexually active with one partner without using contraception. Pregnancy test was negative. Will also evaluate for anemia and possible electrolyte abnormalities that maybe contributing to patient's dizziness with cbc and bmp.

## 2018-01-13 NOTE — Patient Instructions (Signed)
It was a pleasure to see you today Kelly Atkinson. Please make the following changes:  -Please monitor your dizziness and let us know if it worsens. Please also make sure to make necessary modifications below to make sure that you don't fall.  If you have any questions or concerns, please call our clinic at [phone] between 9am-5pm and after hours call [phone] and ask for the internal medicine resident on call. If you feel you are having a medical emergency please call 911.   Thank you, we look forward to help you remain healthy!  Lars Mage, MD Internal Medicine PGY1   It is important to avoid accidents which may result in broken bones.  Here are a few ideas on how to make your home safer so you will be less likely to trip or fall.  1. Use nonskid mats or non slip strips in your shower or tub, on your bathroom floor and around sinks.  If you know that you have spilled water, wipe it up! 2. In the bathroom, it is important to have properly installed grab bars on the walls or on the edge of the tub.  Towel racks are NOT strong enough for you to hold onto or to pull on for support. 3. Stairs and hallways should have enough light.  Add lamps or night lights if you need ore light. 4. It is good to have handrails on both sides of the stairs if possible.  Always fix broken handrails right away. 5. It is important to see the edges of steps.  Paint the edges of outdoor steps white so you can see them better.  Put colored tape on the edge of inside steps. 6. Throw-rugs are dangerous because they can slide.  Removing the rugs is the best idea, but if they must stay, add adhesive carpet tape to prevent slipping. 7. Do not keep things on stairs or in the halls.  Remove small furniture that blocks the halls as it may cause you to trip.  Keep telephone and electrical cords out of the way where you walk. 8. Always were sturdy, rubber-soled shoes for good support.  Never wear just socks,  especially on the stairs.  Socks may cause you to slip or fall.  Do not wear full-length housecoats as you can easily trip on the bottom.  9. Place the things you use the most on the shelves that are the easiest to reach.  If you use a stepstool, make sure it is in good condition.  If you feel unsteady, DO NOT climb, ask for help. 10. If a health professional advises you to use a cane or walker, do not be ashamed.  These items can keep you from falling and breaking your bones.

## 2018-01-13 NOTE — Assessment & Plan Note (Signed)
The patient's allergic rhinitis is well controlled and she does not note any post nasal drip, cough, runny eyes, or shortness of breath.

## 2018-01-13 NOTE — Assessment & Plan Note (Addendum)
The patient is pre-diabetic and has historically had a1c in normal range. The patient's last a1c=5.8 in November 2018. The patient is not currently on any medication. The patient does not note any weight changes.    Assessment and Plan -A1c=5.9 during this visit. Her glucose levels continue to remain in good control. Will recheck in 1 year.

## 2018-01-14 LAB — CBC
HEMOGLOBIN: 12.6 g/dL (ref 11.1–15.9)
Hematocrit: 36.7 % (ref 34.0–46.6)
MCH: 31.4 pg (ref 26.6–33.0)
MCHC: 34.3 g/dL (ref 31.5–35.7)
MCV: 92 fL (ref 79–97)
PLATELETS: 173 10*3/uL (ref 150–450)
RBC: 4.01 x10E6/uL (ref 3.77–5.28)
RDW: 13.5 % (ref 12.3–15.4)
WBC: 7.5 10*3/uL (ref 3.4–10.8)

## 2018-01-14 LAB — BMP8+ANION GAP
Anion Gap: 15 mmol/L (ref 10.0–18.0)
BUN/Creatinine Ratio: 22 (ref 9–23)
BUN: 11 mg/dL (ref 6–24)
CHLORIDE: 104 mmol/L (ref 96–106)
CO2: 24 mmol/L (ref 20–29)
Calcium: 9.3 mg/dL (ref 8.7–10.2)
Creatinine, Ser: 0.5 mg/dL — ABNORMAL LOW (ref 0.57–1.00)
GFR calc Af Amer: 137 mL/min/{1.73_m2} (ref >59)
GFR calc non Af Amer: 119 mL/min/{1.73_m2} (ref >59)
GLUCOSE: 109 mg/dL — AB (ref 65–99)
POTASSIUM: 3.9 mmol/L (ref 3.5–5.2)
Sodium: 143 mmol/L (ref 134–144)

## 2018-01-14 LAB — TSH: TSH: 1.72 u[IU]/mL (ref 0.450–4.500)

## 2018-01-16 NOTE — Progress Notes (Addendum)
Internal Medicine Clinic Attending  Case discussed with Dr. Maricela Bo at the time of the visit.  We reviewed the resident's history and exam and pertinent patient test results.  I agree with the assessment, diagnosis, and plan of care documented in the resident's note.  Labwork does appear to be non helpful in this patient.  Would consider having her back in to assess for continued symptoms and consider further work up.  Have sent message to Dr. Maricela Bo to further discuss.

## 2018-01-17 ENCOUNTER — Telehealth: Payer: Self-pay | Admitting: Internal Medicine

## 2018-01-17 NOTE — Telephone Encounter (Signed)
Called patient and informed her of normal lab results. She mentioned that she had one episode of dizziness yesterday which lasted 40 seconds. She felt that this episode was not as bad as prior. She was standing up hanging clothes when she noted the dizziness. She denied any accompanied nausea, palpitations, sob, weakness in body, numbness, or tingling. She has remained well hydrated and states that she feels stressed at work and due to personal issues.  Recommended patient to : -Return in one month to determine if the patient needs further cardiac/neurological workup (holter monitor and cardiac referral) -Told her to call back clinic if she experiences dizziness again  Lars Mage, MD Internal Medicine PGY2 Pager:[phone] 01/17/2018, 8:37 AM

## 2018-02-17 ENCOUNTER — Encounter: Payer: BLUE CROSS/BLUE SHIELD | Admitting: Internal Medicine

## 2018-05-23 ENCOUNTER — Ambulatory Visit (INDEPENDENT_AMBULATORY_CARE_PROVIDER_SITE_OTHER): Payer: BLUE CROSS/BLUE SHIELD | Admitting: *Deleted

## 2018-05-23 DIAGNOSIS — Z23 Encounter for immunization: Secondary | ICD-10-CM

## 2018-06-12 ENCOUNTER — Ambulatory Visit (INDEPENDENT_AMBULATORY_CARE_PROVIDER_SITE_OTHER): Payer: BLUE CROSS/BLUE SHIELD | Admitting: Internal Medicine

## 2018-06-12 ENCOUNTER — Other Ambulatory Visit: Payer: Self-pay

## 2018-06-12 DIAGNOSIS — J329 Chronic sinusitis, unspecified: Secondary | ICD-10-CM | POA: Insufficient documentation

## 2018-06-12 DIAGNOSIS — J0110 Acute frontal sinusitis, unspecified: Secondary | ICD-10-CM

## 2018-06-12 DIAGNOSIS — J069 Acute upper respiratory infection, unspecified: Secondary | ICD-10-CM | POA: Diagnosis not present

## 2018-06-12 NOTE — Progress Notes (Signed)
   CC: sinus headache   HPI:  Kelly Atkinson is a 42 y.o. with PMH as listed below who presents for sinus headache. Please see the assessment and plans for the status of the patient chronic medical problems.   Past Medical History:  Diagnosis Date  . GERD (gastroesophageal reflux disease)   . Low back pain    Review of Systems:  Refer to history of present illness and assessment and plans for pertinent review of systems, all others reviewed and negative  Physical Exam:  Vitals:   06/12/18 1548  BP: 114/67  Pulse: 70  Temp: 99.5 F (37.5 C)  TempSrc: Oral  SpO2: 100%  Weight: 140 lb 4.8 oz (63.6 kg)  Height: 5\' 3"  (1.6 m)   HEENT: Frontal sinus tenderness, normal external auditory canals, no effusion behind the tympanic membrane, no tonsillar erythema or exudate, Moist mucous membranes  Cardiac: regular rate and rhythm, no murmur  Pulm: lungs are clear to auscultation   Assessment & Plan:   sinusitis  Patient describes three days of body aches, fever, dizziness and headache and sinus congestion, dry cough, chills. She also has mild rhinorrhea and sore throat. Generally symptoms have only gotten a little better. She has no sick contacts. She has a decreased appetite but continues to have normal oral intake. She has had similar symptoms in the past but usually they resolve in a day after taking some medicaiton. She received her flu vaccine this year. She has been using tylenol and alka seltzer, theraflu to manage her symptosm.  - sinus irrigation  - discussed return precautions   See Encounters Tab for problem based charting.  Patient discussed with Dr. Angelia Mould

## 2018-06-12 NOTE — Patient Instructions (Signed)
Thank you for coming to the clinic today. It was a pleasure to see you.   For your cold - please start using the sinus washes described below. Call our clinic if your symptom are not getting better over the next two weeks.   Please call the internal medicine center clinic if you have any questions or concerns, we may be able to help and keep you from a long and expensive emergency room wait. Our clinic and after hours phone number is [phone], the best time to call is Monday through Friday 9 am to 4 pm but there is always someone available 24/7 if you have an emergency. If you need medication refills please notify your pharmacy one week in advance and they will send [name] a request.   You have a viral infection of your sinuses and upper respiratory tract. Unfortunately, we do not have any good antibiotics to treat this infection. However, it should resolve on its own in about 10 days.  In the meantime, there are several good remedies that will treat your symptoms and hopefully help to make you more comfortable. Please look for one of the over-the-counter medications below that will help with your congestion, cough, and post-nasal drip. Additionally, it will be very important to use nasal irrigation and nasal steroid spray to clear you sinus congestion. I have provided a recipe and instructions below.     BUFFERED ISOTONIC SALINE NASAL IRRIGATION  The Benefits:  1. When you irrigate, the isotonic saline (salt water) acts as a solvent and washes the mucus crusts and other debris from your nose.  2. This decongests and improves the airflow into your nose. The sinus passages begin to open.   Over the counter:  I recommend a Squirt Bottle form of saline to provide an adequate volume of irrigation to clear your sinuses.   Home Recipe:  1. Choose a 1-quart glass jar that is thoroughly cleansed.  2. Fill with sterile or distilled water, or you can boil water from the tap.  3. Add 1  to 2 heaping teaspoons of "pickling/canning/sea" salt (NOT table salt as it contains a large number of additives). This salt is available at the grocery store in the food canning section.  4. Mix ingredients together and store at room temperature. Discard after one week. If you find this solution too strong, you may decrease the amount of salt added to 1 to 1  teaspoons. With children it is often best to start with a milder solution and advance slowly. Irrigate with 240 ml (8 oz) twice daily.  The Instructions:  You should plan to irrigate your nose with buffered isotonic saline 2 times per day. Many people prefer to warm the solution slightly in the microwave - but be sure that the solution is NOT HOT. Stand over the sink (some do this in the shower) and squirt the solution into each side of your nose, keeping your mouth open. This allows you to spit the saltwater out of your mouth. It will not harm you if you swallow a little.  If you have been told to use a nasal steroid such as Flonase, Nasonex, or Nasacort, you should always use isortonic saline solution first, then use your nasal steroid product. The nasal steroid is much more effective when sprayed onto clean nasal membranes and the steroid medicine will reach deeper into the nose.  Most people experience a little burning sensation the first few times they use a isotonic saline solution, but this  usually goes away within a few days.

## 2018-06-12 NOTE — Assessment & Plan Note (Signed)
Patient describes three days of body aches, fever, dizziness and headache and sinus congestion, dry cough, chills. She also has mild rhinorrhea and sore throat. Generally symptoms have only gotten a little better. She has no sick contacts. She has a decreased appetite but continues to have normal oral intake. She has had similar symptoms in the past but usually they resolve in a day after taking some medicaiton. She received her flu vaccine this year. She has been using tylenol and alka seltzer, theraflu to manage her symptosm.  - sinus irrigation  - discussed return precautions

## 2018-06-14 NOTE — Progress Notes (Signed)
Internal Medicine Clinic Attending  Case discussed with Dr. Blum at the time of the visit.  We reviewed the resident's history and exam and pertinent patient test results.  I agree with the assessment, diagnosis, and plan of care documented in the resident's note. 

## 2018-07-04 DIAGNOSIS — Z1151 Encounter for screening for human papillomavirus (HPV): Secondary | ICD-10-CM | POA: Diagnosis not present

## 2018-07-04 DIAGNOSIS — Z01419 Encounter for gynecological examination (general) (routine) without abnormal findings: Secondary | ICD-10-CM | POA: Diagnosis not present

## 2018-07-04 DIAGNOSIS — Z124 Encounter for screening for malignant neoplasm of cervix: Secondary | ICD-10-CM | POA: Diagnosis not present

## 2018-07-04 DIAGNOSIS — Z113 Encounter for screening for infections with a predominantly sexual mode of transmission: Secondary | ICD-10-CM | POA: Diagnosis not present

## 2018-07-04 DIAGNOSIS — Z1231 Encounter for screening mammogram for malignant neoplasm of breast: Secondary | ICD-10-CM | POA: Diagnosis not present

## 2018-07-04 DIAGNOSIS — Z6826 Body mass index (BMI) 26.0-26.9, adult: Secondary | ICD-10-CM | POA: Diagnosis not present

## 2018-07-07 ENCOUNTER — Ambulatory Visit: Payer: BLUE CROSS/BLUE SHIELD | Admitting: Internal Medicine

## 2018-07-07 ENCOUNTER — Encounter: Payer: Self-pay | Admitting: Internal Medicine

## 2018-07-07 ENCOUNTER — Other Ambulatory Visit: Payer: Self-pay

## 2018-07-07 VITALS — BP 100/50 | HR 72 | Temp 98.1°F | Ht 63.0 in | Wt 141.7 lb

## 2018-07-07 DIAGNOSIS — R7303 Prediabetes: Secondary | ICD-10-CM | POA: Diagnosis not present

## 2018-07-07 DIAGNOSIS — Z0000 Encounter for general adult medical examination without abnormal findings: Secondary | ICD-10-CM | POA: Diagnosis not present

## 2018-07-07 DIAGNOSIS — K219 Gastro-esophageal reflux disease without esophagitis: Secondary | ICD-10-CM

## 2018-07-07 DIAGNOSIS — J3089 Other allergic rhinitis: Secondary | ICD-10-CM

## 2018-07-07 DIAGNOSIS — K760 Fatty (change of) liver, not elsewhere classified: Secondary | ICD-10-CM | POA: Diagnosis not present

## 2018-07-07 NOTE — Assessment & Plan Note (Signed)
Patient states that she has not had any acid reflux or ruq abdominal pain. She had a ruq ultrasound done in June 2018 that showed a degree of hepatic steatosis without any focal liver lesions. The patient may have fatty liver disease. Recommended the patient continue to eat healthy and exercise. The patient refuses to use protonix as she does not like using medication.

## 2018-07-07 NOTE — Assessment & Plan Note (Signed)
The patient states that she has not had any runny nose, cough, dyspnea, or sneezing recently. She states that she has stopped using flonase as she does not like to take medication. Will continue to monitor.

## 2018-07-07 NOTE — Patient Instructions (Signed)
It was a pleasure to see you today Kelly Atkinson! Your blood pressure is well controlled and you are up to date on your preventative examinations. Please continue to maintain a healthy lifestyle with good diet and exercise. Thank you!  If you have any questions or concerns, please call our clinic at [phone] between 9am-5pm and after hours call [phone] and ask for the internal medicine resident on call. If you feel you are having a medical emergency please call 911.   Thank you, we look forward to help you remain healthy!  Lars Mage, MD Internal Medicine PGY2

## 2018-07-07 NOTE — Assessment & Plan Note (Signed)
The patient is due for a pap smear. She states that she got it at [facility]. We will request records from them.

## 2018-07-07 NOTE — Progress Notes (Signed)
Called [facility] for last pap result - left message.

## 2018-07-07 NOTE — Progress Notes (Signed)
   CC: Follow up of chronic medical problems  HPI:  [name] is a 42 y.o. with allergic rhinitis and pre-diabetes here for wellness visit. Please see problem based charting for evaluation, assessment, and plan. The patient states that she is doing well without any significant health concerns. She has two sons that are in their 70s. She is continuing to menstruate once monthly with each menstrual period lasting 4-5 days. She wears her seatbelt regularly. She is up to date on her preventative exams and procedures.    Past Medical History:  Diagnosis Date  . GERD (gastroesophageal reflux disease)   . Low back pain    Review of Systems:    Review of Systems  Constitutional: Negative for fever and weight loss.  Cardiovascular: Negative for chest pain.  Gastrointestinal: Negative for nausea and vomiting.  Neurological: Negative for dizziness and headaches.     Physical Exam:  Vitals:   07/07/18 1515  BP: (!) 100/50  Pulse: 72  Temp: 98.1 F (36.7 C)  TempSrc: Oral  SpO2: 99%  Weight: 141 lb 11.2 oz (64.3 kg)  Height: 5\' 3"  (1.6 m)   Physical Exam  Constitutional: Appears well-developed and well-nourished. No distress.  HENT:  Head: Normocephalic and atraumatic.  Eyes: Conjunctivae are normal.  Cardiovascular: Normal rate, regular rhythm and normal heart sounds.  Respiratory: Effort normal and breath sounds normal. No respiratory distress. No wheezes.  GI: Soft. Bowel sounds are normal. No distension. There is no tenderness.  Musculoskeletal: No edema.  Neurological: Is alert.  Skin: Not diaphoretic. No erythema.  Psychiatric: Normal mood and affect. Behavior is normal. Judgment and thought content normal.   Assessment & Plan:   See Encounters Tab for problem based charting.  Patient discussed with Dr. Evette Doffing

## 2018-07-10 NOTE — Progress Notes (Signed)
Internal Medicine Clinic Attending  Case discussed with Dr. Chundi at the time of the visit.  We reviewed the resident's history and exam and pertinent patient test results.  I agree with the assessment, diagnosis, and plan of care documented in the resident's note. 

## 2018-07-10 NOTE — Addendum Note (Signed)
Addended by: Lalla Brothers T on: 07/10/2018 08:12 AM   Modules accepted: Level of Service

## 2018-09-29 ENCOUNTER — Encounter: Payer: Self-pay | Admitting: Internal Medicine

## 2018-12-15 DIAGNOSIS — L309 Dermatitis, unspecified: Secondary | ICD-10-CM | POA: Diagnosis not present

## 2019-04-30 NOTE — Progress Notes (Signed)
   CC: Pre-diabetes follow up  HPI:  [name] is a 43 y.o. with pre-diabetes who presents for a follow up. Please see problem based charting for evaluation, assessment, and plan.   Past Medical History:  Diagnosis Date  . GERD (gastroesophageal reflux disease)   . Low back pain    Review of Systems:    Review of Systems  Constitutional: Negative for chills and fever.  Respiratory: Negative for cough and shortness of breath.   Gastrointestinal: Negative for abdominal pain, nausea and vomiting.  Neurological: Negative for dizziness and headaches.    Physical Exam:  Vitals:   05/01/19 1618 05/01/19 1648  BP: (!) 88/41 (!) 93/54  Pulse: (!) 57 (!) 58  Temp:  97.9 F (36.6 C)  TempSrc:  Oral  SpO2: 100% 100%  Weight: 148 lb 8 oz (67.4 kg)   Height: 5\' 1"  (1.549 m)    Physical Exam  Constitutional: Appears well-developed and well-nourished. No distress.  HENT:  Head: Normocephalic and atraumatic.  Eyes: Conjunctivae are normal.  Cardiovascular: Normal rate, regular rhythm and normal heart sounds.  Respiratory: Effort normal and breath sounds normal. No respiratory distress. No wheezes.  GI: Soft. Bowel sounds are normal. No distension. There is no tenderness.  Musculoskeletal: No edema.  Neurological: Is alert.  Skin: Not diaphoretic. No erythema.  Psychiatric: Normal mood and affect. Behavior is normal. Judgment and thought content normal.          Assessment & Plan:   See Encounters Tab for problem based charting.  Patient discussed with Dr. Rebeca Alert

## 2019-05-01 ENCOUNTER — Encounter: Payer: Self-pay | Admitting: Internal Medicine

## 2019-05-01 ENCOUNTER — Ambulatory Visit: Payer: BC Managed Care – PPO | Admitting: Internal Medicine

## 2019-05-01 ENCOUNTER — Other Ambulatory Visit: Payer: Self-pay

## 2019-05-01 VITALS — BP 93/54 | HR 58 | Temp 97.9°F | Ht 61.0 in | Wt 148.5 lb

## 2019-05-01 DIAGNOSIS — R7303 Prediabetes: Secondary | ICD-10-CM | POA: Diagnosis not present

## 2019-05-01 DIAGNOSIS — L819 Disorder of pigmentation, unspecified: Secondary | ICD-10-CM

## 2019-05-01 DIAGNOSIS — L814 Other melanin hyperpigmentation: Secondary | ICD-10-CM | POA: Diagnosis not present

## 2019-05-01 DIAGNOSIS — L538 Other specified erythematous conditions: Secondary | ICD-10-CM

## 2019-05-01 LAB — GLUCOSE, CAPILLARY: Glucose-Capillary: 147 mg/dL — ABNORMAL HIGH (ref 70–99)

## 2019-05-01 LAB — POCT GLYCOSYLATED HEMOGLOBIN (HGB A1C): Hemoglobin A1C: 5.9 % — AB (ref 4.0–5.6)

## 2019-05-01 NOTE — Patient Instructions (Addendum)
It was a pleasure to see you today Ms. Silva-Guiterrez. Please make the following changes:  -please continue to eat healthy and exercise regularly  -I have taken some blood work today and will call you with results.   If you have any questions or concerns, please call our clinic at [phone] between 9am-5pm and after hours call [phone] and ask for the internal medicine resident on call. If you feel you are having a medical emergency please call 911.   Thank you, we look forward to help you remain healthy!  Lars Mage, MD Internal Medicine PGY3

## 2019-05-02 DIAGNOSIS — L819 Disorder of pigmentation, unspecified: Secondary | ICD-10-CM | POA: Insufficient documentation

## 2019-05-02 LAB — CMP14 + ANION GAP
ALT: 20 IU/L (ref 0–32)
AST: 25 IU/L (ref 0–40)
Albumin/Globulin Ratio: 1.7 (ref 1.2–2.2)
Albumin: 4.1 g/dL (ref 3.8–4.8)
Alkaline Phosphatase: 93 IU/L (ref 39–117)
Anion Gap: 11 mmol/L (ref 10.0–18.0)
BUN/Creatinine Ratio: 16 (ref 9–23)
BUN: 10 mg/dL (ref 6–24)
Bilirubin Total: <0.2 mg/dL (ref 0.0–1.2)
CO2: 25 mmol/L (ref 20–29)
Calcium: 8.8 mg/dL (ref 8.7–10.2)
Chloride: 103 mmol/L (ref 96–106)
Creatinine, Ser: 0.64 mg/dL (ref 0.57–1.00)
GFR calc Af Amer: 125 mL/min/{1.73_m2} (ref >59)
GFR calc non Af Amer: 108 mL/min/{1.73_m2} (ref >59)
Globulin, Total: 2.4 g/dL (ref 1.5–4.5)
Glucose: 131 mg/dL — ABNORMAL HIGH (ref 65–99)
Potassium: 3.8 mmol/L (ref 3.5–5.2)
Sodium: 139 mmol/L (ref 134–144)
Total Protein: 6.5 g/dL (ref 6.0–8.5)

## 2019-05-02 LAB — LIPID PANEL
Chol/HDL Ratio: 2.7 ratio (ref 0.0–4.4)
Cholesterol, Total: 115 mg/dL (ref 100–199)
HDL: 43 mg/dL (ref >39)
LDL Chol Calc (NIH): 54 mg/dL (ref 0–99)
Triglycerides: 91 mg/dL (ref 0–149)
VLDL Cholesterol Cal: 18 mg/dL (ref 5–40)

## 2019-05-02 LAB — TSH: TSH: 2.11 u[IU]/mL (ref 0.450–4.500)

## 2019-05-02 LAB — FERRITIN: Ferritin: 123 ng/mL (ref 15–150)

## 2019-05-02 NOTE — Assessment & Plan Note (Addendum)
Patient states that she has noticed that she has orange colored palms and soles for the past 2 years. No other areas of hyperpigmentation. She first noticed when people around her have commented on it. The patient states that it does not bother her, there is no swelling, pain, or warmth.   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 She does not eat carrots or tomatoes, but she does eat papaya. Patient does not have signs of adrenal disease, will evaluate for bronze diabetes.   -Will check cmp, cbc, tsh, lipid panel, ferritin -if continues to persist may order b-carotene level and evaluate for zinc deficiency at next visit -may consider referral to dermatology

## 2019-05-02 NOTE — Assessment & Plan Note (Signed)
The patient's last a1c=5.9 in June 2019 and it 5.9 today as well. Patient is currently not on any medications. Patient has gained 7lbs over the past 10 months. She is not sure why she is gaining weight. She has been trying to eat healthy and exercise.   Assessment and plan  Patient's a1c is at goal. Recommended patient continue to exercise daily.

## 2019-05-03 NOTE — Progress Notes (Signed)
Internal Medicine Clinic Attending  Case discussed with Dr. Chundi at the time of the visit.  We reviewed the resident's history and exam and pertinent patient test results.  I agree with the assessment, diagnosis, and plan of care documented in the resident's note.  Alexander Raines, M.D., Ph.D.  

## 2019-06-09 NOTE — Progress Notes (Signed)
   CC: Follow up for dizziness   HPI:  Kelly Atkinson is a 43 y.o. who presents for follow up. Please see problem based charting for evaluation, assessment, and plan.  Past Medical History:  Diagnosis Date  . GERD (gastroesophageal reflux disease)   . Low back pain    Review of Systems:    Review of Systems  Constitutional: Negative for chills and fever.  Respiratory: Negative for cough and shortness of breath.   Cardiovascular: Negative for chest pain.  Gastrointestinal: Negative for abdominal pain, nausea and vomiting.  Neurological: Positive for dizziness and headaches.   Physical Exam:  Vitals:   06/12/19 1553  BP: (!) 106/54  Pulse: (!) 56  Temp: 98.7 F (37.1 C)  TempSrc: Oral  SpO2: 100%  Weight: 144 lb 11.2 oz (65.6 kg)  Height: 5\' 1"  (1.549 m)   Physical Exam  Constitutional: Appears well-developed and well-nourished. No distress.  HENT:  Head: Normocephalic and atraumatic.  Eyes: Conjunctivae are normal.  Cardiovascular: Normal rate, regular rhythm and normal heart sounds. Varicose veins seen on bilateral lower extremities Respiratory: Effort normal and breath sounds normal. No respiratory distress. No wheezes.  GI: Soft. Bowel sounds are normal. No distension. There is no tenderness.  Musculoskeletal: No edema.  Neurological: Is alert.  Skin: Not diaphoretic. No erythema. Mild orange colored palms and soles. Psychiatric: Normal mood and affect. Behavior is normal. Judgment and thought content normal.      Assessment & Plan:   See Encounters Tab for problem based charting.  Patient discussed with Dr. Evette Doffing

## 2019-06-12 ENCOUNTER — Encounter: Payer: Self-pay | Admitting: Internal Medicine

## 2019-06-12 ENCOUNTER — Ambulatory Visit: Payer: BC Managed Care – PPO | Admitting: Internal Medicine

## 2019-06-12 VITALS — BP 106/54 | HR 56 | Temp 98.7°F | Ht 61.0 in | Wt 144.7 lb

## 2019-06-12 DIAGNOSIS — B351 Tinea unguium: Secondary | ICD-10-CM

## 2019-06-12 DIAGNOSIS — L819 Disorder of pigmentation, unspecified: Secondary | ICD-10-CM | POA: Diagnosis not present

## 2019-06-12 DIAGNOSIS — I8393 Asymptomatic varicose veins of bilateral lower extremities: Secondary | ICD-10-CM

## 2019-06-12 DIAGNOSIS — Z0000 Encounter for general adult medical examination without abnormal findings: Secondary | ICD-10-CM

## 2019-06-12 DIAGNOSIS — R42 Dizziness and giddiness: Secondary | ICD-10-CM

## 2019-06-12 DIAGNOSIS — R519 Headache, unspecified: Secondary | ICD-10-CM

## 2019-06-12 MED ORDER — CICLOPIROX 8 % EX SOLN: Freq: Every day | CUTANEOUS | 0 refills | Status: AC

## 2019-06-12 MED ORDER — CICLOPIROX 8 % EX SOLN: Freq: Every day | CUTANEOUS | 0 refills | Status: DC

## 2019-06-12 NOTE — Patient Instructions (Addendum)
It was a pleasure to see you today Kelly Atkinson. Please make the following changes:  For your orange colored palms and soles your bloodwork did not show any abnormalities. Please let us know if you experience any discomfort.   For your varicose veins I have referred you to vein and vascular surgery for sclerotherapy. Please also wear compression stockings  For your toe fungal infection  Please start using penlac over the toe.   If you have any questions or concerns, please call our clinic at [phone] between 9am-5pm and after hours call [phone] and ask for the internal medicine resident on call. If you feel you are having a medical emergency please call 911.   Thank you, we look forward to help you remain healthy!  Lars Mage, MD Internal Medicine PGY3   Varicose Veins Varicose veins are veins that have become enlarged, bulged, and twisted. They most often appear in the legs. What are the causes? This condition is caused by damage to the valves in the vein. These valves help blood return to your heart. When they are damaged and they stop working properly, blood may flow backward and back up in the veins near the skin, causing the veins to get larger and appear twisted. The condition can result from any issue that causes blood to back up, like pregnancy, prolonged standing, or obesity. What increases the risk? This condition is more likely to develop in people who are:  On their feet a lot.  Pregnant.  Overweight. What are the signs or symptoms? Symptoms of this condition include:  Bulging, twisted, and bluish veins.  A feeling of heaviness. This may be worse at the end of the day.  Leg pain. This may be worse at the end of the day.  Swelling in the leg.  Changes in skin color over the veins. How is this diagnosed? This condition may be diagnosed based on your symptoms, a physical exam, and an ultrasound test. How is this treated? Treatment for this  condition may involve:  Avoiding sitting or standing in one position for long periods of time.  Wearing compression stockings. These stockings help to prevent blood clots and reduce swelling in the legs.  Raising (elevating) the legs when resting.  Losing weight.  Exercising regularly. If you have persistent symptoms or want to improve the way your varicose veins look, you may choose to have a procedure to close the varicose veins off or to remove them. Treatments to close off the veins include:  Sclerotherapy. In this treatment, a solution is injected into a vein to close it off.  Laser treatment. In this treatment, the vein is heated with a laser to close it off.  Radiofrequency vein ablation. In this treatment, an electrical current produced by radio waves is used to close off the vein. Treatments to remove the veins include:  Phlebectomy. In this treatment, the veins are removed through small incisions made over the veins.  Vein ligation and stripping. In this treatment, incisions are made over the veins. The veins are then removed after being tied (ligated) with stitches (sutures). Follow these instructions at home: Activity  Walk as much as possible. Walking increases blood flow. This helps blood return to the heart and takes pressure off your veins. It also increases your cardiovascular strength.  Follow your health care provider's instructions about exercising.  Do not stand or sit in one position for a long period of time.  Do not sit with your legs crossed.  Rest  with your legs raised during the day. General instructions   Follow any diet instructions given to you by your health care provider.  Wear compression stockings as directed by your health care provider. Do not wear other kinds of tight clothing around your legs, pelvis, or waist.  Elevate your legs at night to above the level of your heart.  If you get a cut in the skin over the varicose vein and the  vein bleeds: ? Lie down with your leg raised. ? Apply firm pressure to the cut with a clean cloth until the bleeding stops. ? Place a bandage (dressing) on the cut. Contact a health care provider if:  The skin around your varicose veins starts to break down.  You have pain, redness, tenderness, or hard swelling over a vein.  You are uncomfortable because of pain.  You get a cut in the skin over a varicose vein and it will not stop bleeding. Summary  Varicose veins are veins that have become enlarged, bulged, and twisted. They most often appear in the legs.  This condition is caused by damage to the valves in the vein. These valves help blood return to your heart.  Treatment for this condition includes frequent movements, wearing compression stockings, losing weight, and exercising regularly. In some cases, procedures are done to close off or remove the veins.  Treatment for this condition may include wearing compression stockings, elevating the legs, losing weight, and engaging in regular activity. In some cases, procedures are done to close off or remove the veins. This information is not intended to replace advice given to you by your health care provider. Make sure you discuss any questions you have with your health care provider. Document Released: 04/14/2005 Document Revised: 08/31/2018 Document Reviewed: 07/28/2016 Elsevier Patient Education  [place].    Ciclopirox nail solution What is this medicine? CICLOPIROX (sye kloe PEER ox) NAIL SOLUTION is an antifungal medicine. It used to treat fungal infections of the nails. This medicine may be used for other purposes; ask your health care provider or pharmacist if you have questions. COMMON BRAND NAME(S): CNL8, Penlac What should I tell my health care provider before I take this medicine? They need to know if you have any of these conditions:  diabetes mellitus  history of seizures  HIV infection  immune system  problems or organ transplant  large areas of burned or damaged skin  peripheral vascular disease or poor circulation  taking corticosteroid medication (including steroid inhalers, cream, or lotion)  an unusual or allergic reaction to ciclopirox, isopropyl alcohol, other medicines, foods, dyes, or preservatives  pregnant or trying to get pregnant  breast-feeding How should I use this medicine? This medicine is for external use only. Follow the directions that come with this medicine exactly. Wash and dry your hands before use. Avoid contact with the eyes, mouth or nose. If you do get this medicine in your eyes, rinse out with plenty of cool tap water. Contact your doctor or health care professional if eye irritation occurs. Use at regular intervals. Do not use your medicine more often than directed. Finish the full course prescribed by your doctor or health care professional even if you think you are better. Do not stop using except on your doctor's advice. Talk to your pediatrician regarding the use of this medicine in children. While this medicine may be prescribed for children as young as 12 years for selected conditions, precautions do apply. Overdosage: If you think you have  taken too much of this medicine contact a poison control center or emergency room at once. NOTE: This medicine is only for you. Do not share this medicine with others. What if I miss a dose? If you miss a dose, use it as soon as you can. If it is almost time for your next dose, use only that dose. Do not use double or extra doses. What may interact with this medicine? Interactions are not expected. Do not use any other skin products without telling your doctor or health care professional. This list may not describe all possible interactions. Give your health care provider a list of all the medicines, herbs, non-prescription drugs, or dietary supplements you use. Also tell them if you smoke, drink alcohol, or use illegal  drugs. Some items may interact with your medicine. What should I watch for while using this medicine? Tell your doctor or health care professional if your symptoms get worse. Four to six months of treatment may be needed for the nail(s) to improve. Some people may not achieve a complete cure or clearing of the nails by this time. Tell your doctor or health care professional if you develop sores or blisters that do not heal properly. If your nail infection returns after stopping using this product, contact your doctor or health care professional. What side effects may I notice from receiving this medicine? Side effects that you should report to your doctor or health care professional as soon as possible:  allergic reactions like skin rash, itching or hives, swelling of the face, lips, or tongue  severe irritation, redness, burning, blistering, peeling, swelling, oozing Side effects that usually do not require medical attention (report to your doctor or health care professional if they continue or are bothersome):  mild reddening of the skin  nail discoloration  temporary burning or mild stinging at the site of application This list may not describe all possible side effects. Call your doctor for medical advice about side effects. You may report side effects to FDA at 1-800-FDA-1088. Where should I keep my medicine? Keep out of the reach of children. Store at room temperature between 15 and 30 degrees C (59 and 86 degrees F). Do not freeze. Protect from light by storing the bottle in the carton after every use. This medicine is flammable. Keep away from heat and flame. Throw away any unused medicine after the expiration date. NOTE: This sheet is a summary. It may not cover all possible information. If you have questions about this medicine, talk to your doctor, pharmacist, or health care provider.  2020 Elsevier/Gold Standard (2007-10-09 16:49:20)

## 2019-06-13 DIAGNOSIS — I8393 Asymptomatic varicose veins of bilateral lower extremities: Secondary | ICD-10-CM | POA: Insufficient documentation

## 2019-06-13 NOTE — Assessment & Plan Note (Signed)
Patient presented for follow up evaluation of her orange colored palms and soles that were seen at previous visit. Labwork was unrevealing. TSH, liver function, kidney function, cholesterol levels, and iron was within normal limits. She was told to decrease excess consumption of vegetables and fruits that have b-carotene in them.   At this visit the patient states that the orange colored pigment has decreased. 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 Of note, it is important to note that the patient folds clothes at a garment factory, however since both her palms and soles were both orange, it is unlikely that the clothes pigment is what caused the change. Additionally, the patient is not on any medications that can cause the color change.   Assessment and plan  The patient does not have any sign, symptoms, or labwork that shows that suggests a systemic cause. The benefit of eating fruits and vegetables outweigh the risk of orange colored skin changes. Therefore, recommended the patient continue to eat a diet with fruits and vegetables even if they may have b-carotene in them.

## 2019-06-13 NOTE — Assessment & Plan Note (Signed)
Patient has thickened and hyperpigmented nail changes on right first toenail that appears like onychomycosis. She was treated with fluconazole in the past which did completely treat it.   Assessment and plan  Since the nail changes are only on one toe will start the patient on ciclopirox toenail solution.

## 2019-06-13 NOTE — Assessment & Plan Note (Signed)
Patient received influenza vaccination in October at [facility]

## 2019-06-13 NOTE — Progress Notes (Signed)
Internal Medicine Clinic Attending  Case discussed with Dr. Chundi at the time of the visit.  We reviewed the resident's history and exam and pertinent patient test results.  I agree with the assessment, diagnosis, and plan of care documented in the resident's note. 

## 2019-06-13 NOTE — Assessment & Plan Note (Signed)
Patient has varicose veins that can be visualized on exam on bilateral lower extremities. Patient is asymptomatic and wants to pursue cosmetic therapy.   Ref

## 2019-07-23 ENCOUNTER — Encounter (HOSPITAL_COMMUNITY): Payer: BC Managed Care – PPO

## 2019-08-23 ENCOUNTER — Encounter: Payer: Self-pay | Admitting: *Deleted

## 2019-08-28 DIAGNOSIS — Z1231 Encounter for screening mammogram for malignant neoplasm of breast: Secondary | ICD-10-CM | POA: Diagnosis not present

## 2019-08-28 DIAGNOSIS — Z118 Encounter for screening for other infectious and parasitic diseases: Secondary | ICD-10-CM | POA: Diagnosis not present

## 2019-08-28 DIAGNOSIS — Z6827 Body mass index (BMI) 27.0-27.9, adult: Secondary | ICD-10-CM | POA: Diagnosis not present

## 2019-08-28 DIAGNOSIS — Z01419 Encounter for gynecological examination (general) (routine) without abnormal findings: Secondary | ICD-10-CM | POA: Diagnosis not present

## 2019-08-28 DIAGNOSIS — Z114 Encounter for screening for human immunodeficiency virus [HIV]: Secondary | ICD-10-CM | POA: Diagnosis not present

## 2019-08-28 DIAGNOSIS — Z113 Encounter for screening for infections with a predominantly sexual mode of transmission: Secondary | ICD-10-CM | POA: Diagnosis not present

## 2019-08-28 DIAGNOSIS — Z1159 Encounter for screening for other viral diseases: Secondary | ICD-10-CM | POA: Diagnosis not present

## 2019-09-20 NOTE — Addendum Note (Signed)
Addended by: Hulan Fray on: 09/20/2019 04:21 PM   Modules accepted: Orders

## 2020-01-04 ENCOUNTER — Encounter: Payer: Self-pay | Admitting: *Deleted

## 2020-04-16 ENCOUNTER — Ambulatory Visit: Payer: BC Managed Care – PPO | Admitting: Student

## 2020-04-16 ENCOUNTER — Encounter: Payer: Self-pay | Admitting: Student

## 2020-04-16 VITALS — BP 96/54 | HR 63 | Temp 98.8°F | Ht 64.0 in | Wt 144.1 lb

## 2020-04-16 DIAGNOSIS — R7303 Prediabetes: Secondary | ICD-10-CM | POA: Diagnosis not present

## 2020-04-16 DIAGNOSIS — R42 Dizziness and giddiness: Secondary | ICD-10-CM | POA: Diagnosis not present

## 2020-04-16 LAB — POCT GLYCOSYLATED HEMOGLOBIN (HGB A1C): Hemoglobin A1C: 6.1 % — AB (ref 4.0–5.6)

## 2020-04-16 LAB — GLUCOSE, CAPILLARY: Glucose-Capillary: 108 mg/dL — ABNORMAL HIGH (ref 70–99)

## 2020-04-16 MED ORDER — MECLIZINE HCL 25 MG PO TABS: 25.0000 mg | ORAL_TABLET | Freq: Three times a day (TID) | ORAL | 0 refills | Status: DC | PRN

## 2020-04-16 NOTE — Patient Instructions (Addendum)
Kelly Atkinson,  It was a pleasure seeing you today!  Today we discussed the dizziness you have been experiencing. At this point after taking your history and performing a physical exam, we are unclear on the cause of the dizziness. We have checked for many different causes which have been negative so far. We are prescribing you meclizine (ANTIVERT) 25mg , three times per day as needed. If the medication is not working or you are experiencing side effects, let us know so we can make some changes in the prescription.  We are checking your A1c today, which is a marker for diabetes. We will call you with the results.  We look forward to seeing you next time. Please call our clinic at [phone] if you have any questions or concerns. The best time to call is Monday-Friday from 9am-4pm, but there is someone available 24/7 at the same number. If you need medication refills, please notify your pharmacy one week in advance and they will send [name] a request.  Thank you for letting us take part in your care. Wishing you the best!  Thank you, Dr. Sanjuan Dame, MD

## 2020-04-18 NOTE — Progress Notes (Signed)
   CC: dizziness  HPI:  Ms.Retina Silva-Gutierrez is a 44 y.o. with medical history as listed below who presents to clinic for dizziness.  Past Medical History:  Diagnosis Date  . GERD (gastroesophageal reflux disease)   . Low back pain    Review of Systems:  As per HPI.  Physical Exam:  Vitals:   04/16/20 1355  BP: (!) 96/54  Pulse: 63  Temp: 98.8 F (37.1 C)  TempSrc: Oral  SpO2: 99%  Weight: 144 lb 1.6 oz (65.4 kg)  Height: 5\' 4"  (1.626 m)   General: Pleasant, well-appearing, no acute distress. CV: Regular rate, rhythm. No murmurs, rubs, gallops. Pulm: Clear to auscultation bilaterally. Neuro: Cranial nerves in tact. No nystagmus present. Dix-Hallpike negative.   Assessment & Plan:   See Encounters Tab for problem based charting.  Patient seen with Dr. Jimmye Norman.

## 2020-04-18 NOTE — Assessment & Plan Note (Addendum)
Re-checking A1c during visit today. Will follow-up results and discuss with patient for further plans.  ADDENDUM: A1c 6.1 (5.9 last year). Discussed result with patient. She prefers to stay off of medication and continue lifestyle modification. Discussed decreasing sugars in diet. Will re-check A1c in 6 months.

## 2020-04-18 NOTE — Assessment & Plan Note (Signed)
Patient presents to clinic with 3 week history of dizziness. States dizziness is not associated with standing or a certain time of day. Describes the dizziness as occurring throughout the day randomly, usually when she is walking around. Says episodes usually stop after lying down for an hour. Denies feeling of room spinning, says she feels like her body is moving around. Denies menorrhagia, hematochezia, hematemesis, chest pain, palpitations, shortness of breath, loss of appetite, decreased fluid and oral intake, headaches, blurry vision, hearing loss, falls, loss of consciousness. Per chart review, patient has intermittent history of dizziness.  A/P: -Orthostatic blood pressures unremarkable. No nystagmus on exam, Dix-Hallpike negative. -Patient not on centrally-acting medications, dizziness does not appear positional or have any associations.  -Discussed with patient symptoms likely due to vertigo -Starting meclizine 25mg , up to three times per day as needed. Discussed with patient if vertigo persists despite the meclizine to make follow-up appointment for further evaluation.

## 2020-04-21 NOTE — Progress Notes (Signed)
Internal Medicine Clinic Attending  I saw and evaluated the patient.  I personally confirmed the key portions of the history and exam documented by Dr. Collene Gobble and I reviewed pertinent patient test results.  The assessment, diagnosis, and plan were formulated together and I agree with the documentation in the resident's note. Benign labyrinthitis or BPPV suspected etiology, though asymptomatic today.

## 2020-12-11 ENCOUNTER — Encounter: Payer: Self-pay | Admitting: *Deleted

## 2021-01-15 ENCOUNTER — Ambulatory Visit: Payer: BC Managed Care – PPO | Admitting: Internal Medicine

## 2021-01-15 ENCOUNTER — Other Ambulatory Visit: Payer: Self-pay

## 2021-01-15 ENCOUNTER — Encounter: Payer: Self-pay | Admitting: Internal Medicine

## 2021-01-15 VITALS — Temp 98.2°F | Ht 62.0 in | Wt 137.1 lb

## 2021-01-15 DIAGNOSIS — R42 Dizziness and giddiness: Secondary | ICD-10-CM

## 2021-01-15 NOTE — Patient Instructions (Signed)
I suspect that your blood pressure is becoming low sometimes, resulting in that dizzy feeling. Make sure to drink plenty of fluid and try to stay out of the heat. If you continue to experience symptoms, please return for further evaluation.

## 2021-01-15 NOTE — Progress Notes (Signed)
   Office Visit   Patient ID: Kelly Atkinson, female    DOB: 02-11-1976, 45 y.o.   MRN: [id number]   PCP: Kelly Clan, DO   Subjective:  Kelly Atkinson is a 45 y.o. year old female who presents for 3 episodes of lightheadedness in the past 4 days. She has difficulty characterizing it but denies vertigo. The episodes last 1-2 minutes. Denies chest pain, palpitations, prodromal symptoms. Denies LOC. 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 She reports drinking around 64oz of water daily.     Objective:   Temp 98.2 F (36.8 C) (Oral)   Ht 5\' 2"  (1.575 m)   Wt 137 lb 1.6 oz (62.2 kg)   SpO2 98%   BMI 25.08 kg/m  BP Readings from Last 3 Encounters:  04/16/20 (!) 96/54  06/12/19 (!) 106/54  05/01/19 (!) 93/54   Cardiac: RRR, no LE edema Pulm: lungs clear   Assessment & Plan:   Problem List Items Addressed This Visit       Other   Dizziness - Primary    presents for 3 episodes of lightheadedness in the past 4 days. She has difficulty characterizing it but denies vertigo. The episodes last 1-2 minutes. Denies chest pain, palpitations, prodromal symptoms. Denies LOC. 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 She reports drinking around 64oz of water daily.   Assessment: suspect orthostasis in the setting of dehydration. Orthostatic vitals are negative in the office today but otherwise her symptoms are consistent. Her blood pressure is on the lower side but consistent with prior recordings. No electrolyte derangement on BMP today. Encouraged her to increase fluid intake when she is working. She understands to follow up if symptoms do not improve or become more frequent.        Relevant Orders   Basic metabolic panel (Completed)   She will follow up with her new PCP, Dr. Elliot Gurney, in 1-2 months.   Pt discussed with Dr. Venetia Maxon, MD Internal Medicine Resident PGY-2 Kelly At[facility] Res[facility] Pager: [phone] 01/18/2021 5:00 PM

## 2021-01-16 LAB — BASIC METABOLIC PANEL
BUN/Creatinine Ratio: 22 (ref 9–23)
BUN: 17 mg/dL (ref 6–24)
CO2: 26 mmol/L (ref 20–29)
Calcium: 9.2 mg/dL (ref 8.7–10.2)
Chloride: 101 mmol/L (ref 96–106)
Creatinine, Ser: 0.79 mg/dL (ref 0.57–1.00)
Glucose: 96 mg/dL (ref 65–99)
Potassium: 4.1 mmol/L (ref 3.5–5.2)
Sodium: 140 mmol/L (ref 134–144)
eGFR: 93 mL/min/{1.73_m2} (ref >59)

## 2021-01-18 NOTE — Assessment & Plan Note (Addendum)
presents for 3 episodes of lightheadedness in the past 4 days. She has difficulty characterizing it but denies vertigo. The episodes last 1-2 minutes. Denies chest pain, palpitations, prodromal symptoms. Denies LOC. 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 She reports drinking around 64oz of water daily.   Assessment: suspect orthostasis in the setting of dehydration. Orthostatic vitals are negative in the office today but otherwise her symptoms are consistent. Her blood pressure is on the lower side but consistent with prior recordings. No electrolyte derangement on BMP today. Encouraged her to increase fluid intake when she is working. She understands to follow up if symptoms do not improve or become more frequent.

## 2021-01-19 NOTE — Progress Notes (Signed)
Internal Medicine Clinic Attending  Case discussed with Dr. Christian  At the time of the visit.  We reviewed the resident's history and exam and pertinent patient test results.  I agree with the assessment, diagnosis, and plan of care documented in the resident's note.  

## 2021-01-29 ENCOUNTER — Ambulatory Visit: Payer: BC Managed Care – PPO | Admitting: Internal Medicine

## 2021-01-29 ENCOUNTER — Other Ambulatory Visit: Payer: Self-pay

## 2021-01-29 ENCOUNTER — Encounter: Payer: Self-pay | Admitting: Internal Medicine

## 2021-01-29 VITALS — BP 103/42 | HR 70 | Temp 98.1°F | Ht 62.0 in | Wt 138.2 lb

## 2021-01-29 DIAGNOSIS — B351 Tinea unguium: Secondary | ICD-10-CM | POA: Diagnosis not present

## 2021-01-29 DIAGNOSIS — R7303 Prediabetes: Secondary | ICD-10-CM | POA: Diagnosis not present

## 2021-01-29 DIAGNOSIS — Z0000 Encounter for general adult medical examination without abnormal findings: Secondary | ICD-10-CM | POA: Diagnosis not present

## 2021-01-29 DIAGNOSIS — R87810 Cervical high risk human papillomavirus (HPV) DNA test positive: Secondary | ICD-10-CM

## 2021-01-29 DIAGNOSIS — G4700 Insomnia, unspecified: Secondary | ICD-10-CM | POA: Diagnosis not present

## 2021-01-29 LAB — GLUCOSE, CAPILLARY: Glucose-Capillary: 106 mg/dL — ABNORMAL HIGH (ref 70–99)

## 2021-01-29 LAB — POCT GLYCOSYLATED HEMOGLOBIN (HGB A1C): Hemoglobin A1C: 5.9 % — AB (ref 4.0–5.6)

## 2021-01-29 MED ORDER — TERBINAFINE HCL 250 MG PO TABS: 250.0000 mg | ORAL_TABLET | Freq: Every day | ORAL | 0 refills | Status: AC

## 2021-01-29 NOTE — Assessment & Plan Note (Signed)
A1c done at this visit, 5.9. Patient states that she has been off of her metformin for about 2 years. Patient will continue with lifestyle modifications. Discussed low sugar and low carb diet.   Plan: - Recheck A1c ~1 year or sooner if significant changes occur

## 2021-01-29 NOTE — Patient Instructions (Addendum)
Kelly Pert, [name] por permitirnos brindarle atencin hoy. hoy discutimos:  1. Diabetes: vamos a revisar su nivel de azcar hoy y hablaremos de reiniciar la metformina si es alta. Si tu nivel de azcar es bueno, no necesitamos hacer nada!  2. Hongos en los dedos del pie: le estoy dando un medicamento oral para la ua del pie. Este medicamento se tomar una vez al da durante 12 semanas. Estamos haciendo anlisis de sangre hoy para verificar su funcin heptica y Psychologist, prison and probation services cita en 6 semanas para verificar este nivel nuevamente.  3. Mantenimiento de la salud: Ests al da con tus exmenes de Papanicolaou. [name] su tarjeta COVID en su prxima visita para que podamos ponerla en nuestra computadora!  4. Sueo: trate de tomar melatonina una vez por noche para ayudarlo a dormir. Si esto no le ayuda, discutiremos otras opciones la prxima vez que lo vea. Puede hacer una cita para verme en unos 2 meses si esto no le ayuda para entonces.  He ordenado los siguientes laboratorios para usted:  Lab Orders  CMP14 + Anion Gap  POC Hbg A1C    Pruebas ordenadas hoy: None  Referencias ordenadas hoy: None  Referral Orders  No referral(s) requested today     He ordenado el siguiente medicamento/cambiado los siguientes medicamentos:  Suspender los siguientes medicamentos: There are no discontinued medications.   Iniciar los siguientes medicamentos: Meds ordered this encounter  Medications   terbinafine (LAMISIL) 250 MG tablet    Sig: Take 1 tablet (250 mg total) by mouth daily.    Dispense:  84 tablet    Refill:  0     Seguimiento : Seguimiento de 6 semanas para el trabajo de laboratorio     Si tiene alguna pregunta o inquietud, llame a la clnica de medicina interna al [phone].     Buddy Duty, D.O. [place]

## 2021-01-29 NOTE — Assessment & Plan Note (Signed)
Patient has thickened and discolored nail changes on the right great toenail that appears consistent with onychomycosis.  She was previously treated with ciclopirox toenail solution however she states that this did not help.   Plan: - Oral terbinafine 250mg  once daily x12 weeks - Baseline CMP today and repeat at 6 weeks

## 2021-01-29 NOTE — Assessment & Plan Note (Signed)
Patient states that she has had difficulty sleeping at night for the past 8 months. Her son moved to [place] around this time and she has had difficulty sleeping since then due to the stress.  She has tried melatonin on occasion but has not tried to use it consistently. States that she sleeps anywhere from 4 to 6 hours a night. She also notes that she does snore but no one has ever told her that she has stopped breathing in her sleep.  Patient does not have a history of hypertension and her BMI is less than 35, thus I have a low suspicion for sleep apnea.  Believe that her sleep disturbance is due to recent stresses.  Advised the patient to try melatonin once a night.  If this does not help the patient we can discuss other options at her next visit.  Plan: - Melatonin once a night

## 2021-01-29 NOTE — Assessment & Plan Note (Signed)
Patient states that she believes she received one dose of the COVID vaccine.  Advised pt to bring in her COVID card at the next visit so we can appropriately document this.  Patient states that her last Pap smear was 1 year ago with her OB/GYN.

## 2021-01-29 NOTE — Progress Notes (Signed)
CC: diabetes check, meet new PCP  HPI:  Kelly Atkinson is a 45 y.o. with a PMHx of pre-diabetes and GERD.  The patient states that she is overall pretty healthy but was just told that she has prediabetes. She is presenting today for A1c check and for possible fungal infection of her toenails.  He states that her right big toenail has been thick and discolored for the past 20 years.  The doctor previously gave her a topical treatment however if this did not help and since then she has not had any other treatment for it.  She does not have any pain associated with it however the appearance is bothersome to the patient.  The patient also states she has difficulty sleeping at night the past 8 months.  Her son moved to [place] around this time and she has had difficulty sleeping since then due to the stress.  Patient denies any fever, chills, chest pain, shortness of breath, abdominal pain, nausea, vomiting, diarrhea, melena, hematochezia, or any other symptoms at this time.  Past Medical History:  Diagnosis Date   GERD (gastroesophageal reflux disease)    Low back pain    Review of Systems:  Review of Systems  Constitutional:  Negative for chills and fever.  HENT:  Negative for hearing loss and tinnitus.   Eyes:  Negative for blurred vision and double vision.  Respiratory:  Negative for cough and shortness of breath.   Cardiovascular:  Negative for chest pain and palpitations.  Gastrointestinal:  Negative for abdominal pain, diarrhea, nausea and vomiting.  Genitourinary:  Negative for dysuria and hematuria.  Musculoskeletal:  Negative for back pain and myalgias.  Neurological:  Negative for dizziness and headaches.  Psychiatric/Behavioral:  Negative for depression.        + sleep disturbance    Physical Exam:  Vitals:   01/29/21 1437  BP: (!) 103/42  Pulse: 70  Temp: 98.1 F (36.7 C)  TempSrc: Oral  SpO2: 99%  Weight: 138 lb 3.2 oz (62.7 kg)  Height: 5\' 2"  (1.575 m)    Physical Exam Constitutional:      General: She is not in acute distress.    Appearance: Normal appearance.  HENT:     Head: Normocephalic and atraumatic.  Eyes:     Pupils: Pupils are equal, round, and reactive to light.  Cardiovascular:     Rate and Rhythm: Regular rhythm. Tachycardia present.     Pulses: Normal pulses.     Heart sounds: Normal heart sounds. No murmur heard. Pulmonary:     Effort: Pulmonary effort is normal. No respiratory distress.     Breath sounds: Normal breath sounds. No wheezing, rhonchi or rales.  Abdominal:     General: Bowel sounds are normal.     Palpations: Abdomen is soft.     Tenderness: There is no abdominal tenderness.  Musculoskeletal:     Right lower leg: No edema.     Left lower leg: No edema.  Feet:     Right foot:     Toenail Condition: Right toenails are abnormally thick. Fungal disease present.    Left foot:     Toenail Condition: Left toenails are normal.  Skin:    General: Skin is warm and dry.  Neurological:     General: No focal deficit present.     Mental Status: She is alert and oriented to person, place, and time.  Psychiatric:        Mood and Affect: Mood normal.  Behavior: Behavior normal.     Assessment & Plan:   See Encounters Tab for problem based charting.  Patient seen with Dr. Daryll Drown

## 2021-01-30 LAB — CMP14 + ANION GAP
ALT: 19 IU/L (ref 0–32)
AST: 22 IU/L (ref 0–40)
Albumin/Globulin Ratio: 1.6 (ref 1.2–2.2)
Albumin: 4.4 g/dL (ref 3.8–4.8)
Alkaline Phosphatase: 94 IU/L (ref 44–121)
Anion Gap: 13 mmol/L (ref 10.0–18.0)
BUN/Creatinine Ratio: 20 (ref 9–23)
BUN: 15 mg/dL (ref 6–24)
Bilirubin Total: <0.2 mg/dL (ref 0.0–1.2)
CO2: 27 mmol/L (ref 20–29)
Calcium: 9.1 mg/dL (ref 8.7–10.2)
Chloride: 101 mmol/L (ref 96–106)
Creatinine, Ser: 0.74 mg/dL (ref 0.57–1.00)
Globulin, Total: 2.7 g/dL (ref 1.5–4.5)
Glucose: 107 mg/dL — ABNORMAL HIGH (ref 65–99)
Potassium: 4 mmol/L (ref 3.5–5.2)
Sodium: 141 mmol/L (ref 134–144)
Total Protein: 7.1 g/dL (ref 6.0–8.5)
eGFR: 101 mL/min/{1.73_m2} (ref >59)

## 2021-02-09 NOTE — Progress Notes (Signed)
Internal Medicine Clinic Attending ° °I saw and evaluated the patient.  I personally confirmed the key portions of the history and exam documented by Dr. Atway and I reviewed pertinent patient test results.  The assessment, diagnosis, and plan were formulated together and I agree with the documentation in the resident’s note.  °

## 2021-03-11 ENCOUNTER — Other Ambulatory Visit: Payer: Self-pay | Admitting: Internal Medicine

## 2021-03-11 DIAGNOSIS — B351 Tinea unguium: Secondary | ICD-10-CM

## 2021-03-12 ENCOUNTER — Other Ambulatory Visit: Payer: BC Managed Care – PPO

## 2021-03-12 DIAGNOSIS — B351 Tinea unguium: Secondary | ICD-10-CM

## 2021-03-13 LAB — CMP14 + ANION GAP
ALT: 18 IU/L (ref 0–32)
AST: 20 IU/L (ref 0–40)
Albumin/Globulin Ratio: 1.6 (ref 1.2–2.2)
Albumin: 4.1 g/dL (ref 3.8–4.8)
Alkaline Phosphatase: 90 IU/L (ref 44–121)
Anion Gap: 13 mmol/L (ref 10.0–18.0)
BUN/Creatinine Ratio: 17 (ref 9–23)
BUN: 15 mg/dL (ref 6–24)
Bilirubin Total: <0.2 mg/dL (ref 0.0–1.2)
CO2: 26 mmol/L (ref 20–29)
Calcium: 9.1 mg/dL (ref 8.7–10.2)
Chloride: 103 mmol/L (ref 96–106)
Creatinine, Ser: 0.89 mg/dL (ref 0.57–1.00)
Globulin, Total: 2.5 g/dL (ref 1.5–4.5)
Glucose: 86 mg/dL (ref 65–99)
Potassium: 4 mmol/L (ref 3.5–5.2)
Sodium: 142 mmol/L (ref 134–144)
Total Protein: 6.6 g/dL (ref 6.0–8.5)
eGFR: 81 mL/min/{1.73_m2} (ref >59)

## 2021-04-02 ENCOUNTER — Ambulatory Visit: Payer: BC Managed Care – PPO | Admitting: Internal Medicine

## 2021-04-02 ENCOUNTER — Encounter: Payer: Self-pay | Admitting: Internal Medicine

## 2021-04-02 ENCOUNTER — Other Ambulatory Visit: Payer: Self-pay

## 2021-04-02 DIAGNOSIS — M62830 Muscle spasm of back: Secondary | ICD-10-CM | POA: Insufficient documentation

## 2021-04-02 DIAGNOSIS — G4700 Insomnia, unspecified: Secondary | ICD-10-CM | POA: Diagnosis not present

## 2021-04-02 DIAGNOSIS — B351 Tinea unguium: Secondary | ICD-10-CM

## 2021-04-02 DIAGNOSIS — Z0000 Encounter for general adult medical examination without abnormal findings: Secondary | ICD-10-CM | POA: Diagnosis not present

## 2021-04-02 MED ORDER — CYCLOBENZAPRINE HCL 5 MG PO TABS: 5.0000 mg | ORAL_TABLET | Freq: Every day | ORAL | 0 refills | Status: AC

## 2021-04-02 NOTE — Patient Instructions (Signed)
Thank you, [name] for allowing [name] to provide your care today. Today we discussed: Sleep: I am glad your sleep is improved with melatonin. Continue to take this as needed Fungus infection in toe: Continue to take terbinafine for your toenail infection until the course is completed. It will take about 6 months for your big toe to completely grow out and to appear "normal" again. Back pain: Use a heating pad as needed to help relax your back muscles and also have someone in your house massage the muscles to help with your pain. Also take flexeril - one tablet each night for 5 nights to help your pain Healthcare maintenance: We will wait to do a colonoscopy until you know more information about your insurance. Flu shot can be done at your local pharmacy! Prediabetes: Your a1c was 5.9 in July. We will recheck this next July  I have ordered the following labs for you:  Lab Orders  No laboratory test(s) ordered today      Referrals ordered today:   Referral Orders  No referral(s) requested today     I have ordered the following medication/changed the following medications:   Stop the following medications: There are no discontinued medications.   Start the following medications: Meds ordered this encounter  Medications   cyclobenzaprine (FLEXERIL) 5 MG tablet    Sig: Take 1 tablet (5 mg total) by mouth at bedtime for 5 days.    Dispense:  5 tablet    Refill:  0     Follow up: 1 year     Should you have any questions or concerns please call the internal medicine clinic at [phone].     Buddy Duty, D.O. [place]

## 2021-04-02 NOTE — Assessment & Plan Note (Signed)
Patient continues to have thickening and discoloration of right great toenail consistent with onychomycosis. Compared to her last visit, this is improved, however, the toenail has not grown out yet. The patient has been taking oral terbinafine '250mg'$  once daily and will complete a 12 week course of this. She had a CMP checked at the initiation of this therapy and at 6 weeks, and her liver function remains within normal limits. Discussed with the patient that after the completion of her therapy, that it could take up to 6 months for the great toenail to completley grow out.   Plan: - Continue with 12 wk course of oral terbinafine 250 mg daily

## 2021-04-02 NOTE — Assessment & Plan Note (Signed)
Patient has had a significant improvement in her sleeping habits since her last visit. She reports that she is no longer having difficulty falling asleep and is able to stay asleep for ~7 hours each night. She takes melatonin as needed, but does not need to take this everyday.   Plan: - Continue melatonin PRN

## 2021-04-02 NOTE — Progress Notes (Signed)
   CC: 2 month follow up   HPI:  [name] is a 45 y.o. female with GERD, prediabetes, insomnia, and onychomycosis who presents to the [facility] for a 2 month follow up of insomnia/onchomycosis. Please see problem-based list for further details, assessments, and plans.   Past Medical History:  Diagnosis Date   GERD (gastroesophageal reflux disease)    Low back pain    Review of Systems:  Review of Systems  Constitutional:  Negative for chills and fever.  HENT:  Negative for congestion and sore throat.   Eyes: Negative.   Respiratory:  Negative for cough and shortness of breath.   Cardiovascular:  Negative for chest pain, palpitations and leg swelling.  Gastrointestinal:  Negative for abdominal pain, nausea and vomiting.  Genitourinary: Negative.   Musculoskeletal:  Positive for back pain.  Skin:        Thickened R great toenail   Neurological:  Negative for dizziness and headaches.    Physical Exam:  Vitals:   04/02/21 1433  BP: 95/60  Pulse: 65  Resp: 20  Temp: 98.7 F (37.1 C)  TempSrc: Oral  SpO2: 99%  Weight: 138 lb 1.6 oz (62.6 kg)  Height: '5\' 2"'$  (1.575 m)   General:  No acute distress. CV: RRR. No murmurs, rubs, or gallops. No LE edema Pulmonary: Lungs CTAB. Normal effort. No wheezing or rales. Abdominal: Soft, nontender, nondistended. Normal bowel sounds. Extremities: Palpable radial and DP pulses. Normal ROM. MSK: Normal posturing of spine. Strength 5/5 in bilateral upper and lower extremities. Normal flexion, extension, lateral rotation, and rotation of back.  Skin: Warm and dry. Right great toenail abnormally thick, fungal disease present but noted to be improved compared to last visit Neuro: A&Ox3. Moves all extremities. Normal sensation. No focal deficit. Psych: Normal mood and affect   Assessment & Plan:   See Encounters Tab for problem based charting.  Patient seen with Dr. Jimmye Norman

## 2021-04-02 NOTE — Assessment & Plan Note (Signed)
Flu shot declined today, however, patient states she will get it at local pharmacy  Colonoscopy deferred until patient knows if she will have insurance in the near future

## 2021-04-02 NOTE — Assessment & Plan Note (Signed)
Patient presenting with ~4 weeks of mid back pain that started out of no where. She states that her back feels like there is a pressure/soreness to the area, right below her shoulder blades. She denies any recent injuries, trauma, heavy lifting, or any new activities/stresses. She also denies any fevers, chills, urinary incontinence, or any other red flag symptoms. On exam, she has no midline tenderness to palpation, but does have some tenderness noted in the musculature of her back, below her bilateral shoulder blades. Discussed with the patient that her muscles are tight and she likely is experiencing a spasm of her back muscles.   Plan: - Advised patient to apply heat to the area and massage - Avoid any heavy lifting or strenuous activities until improved - Flexeril '5mg'$  qhs x 5 nights

## 2021-04-06 NOTE — Progress Notes (Signed)
Internal Medicine Clinic Attending ° °I saw and evaluated the patient.  I personally confirmed the key portions of the history and exam documented by Dr. Atway and I reviewed pertinent patient test results.  The assessment, diagnosis, and plan were formulated together and I agree with the documentation in the resident’s note.  °

## 2021-09-15 ENCOUNTER — Ambulatory Visit: Payer: BC Managed Care – PPO | Admitting: Internal Medicine

## 2021-09-15 ENCOUNTER — Other Ambulatory Visit: Payer: Self-pay

## 2021-09-15 ENCOUNTER — Encounter: Payer: Self-pay | Admitting: Internal Medicine

## 2021-09-15 VITALS — BP 98/58 | HR 67 | Temp 98.2°F | Resp 24 | Ht 62.0 in | Wt 141.9 lb

## 2021-09-15 DIAGNOSIS — H6122 Impacted cerumen, left ear: Secondary | ICD-10-CM | POA: Diagnosis not present

## 2021-09-15 DIAGNOSIS — Z1211 Encounter for screening for malignant neoplasm of colon: Secondary | ICD-10-CM | POA: Diagnosis not present

## 2021-09-15 DIAGNOSIS — Z0000 Encounter for general adult medical examination without abnormal findings: Secondary | ICD-10-CM

## 2021-09-15 DIAGNOSIS — H6120 Impacted cerumen, unspecified ear: Secondary | ICD-10-CM | POA: Insufficient documentation

## 2021-09-15 MED ORDER — CARBAMIDE PEROXIDE 6.5 % OT SOLN: 5.0000 [drp] | Freq: Two times a day (BID) | OTIC | 0 refills | Status: AC

## 2021-09-15 NOTE — Patient Instructions (Signed)
Thank you, Ms.Allanna Silva-Gutierrez for allowing [name] to provide your care today. Today we discussed:  Left ear fullness: We cleaned out your left ear today, which should help with the feeling of it being plugged. You can use debrox ear drops when this happens, but do not use this for more than 4 days.   Healthcare maintenance: Please schedule a pap smear on your way out today. Also referred you to get a colonoscopy to check for colon cancer  I have ordered the following labs for you:  Lab Orders  No laboratory test(s) ordered today     Tests ordered today:  none  Referrals ordered today:    Referral Orders         Ambulatory referral to Gastroenterology      I have ordered the following medication/changed the following medications:   Stop the following medications: There are no discontinued medications.   Start the following medications: Meds ordered this encounter  Medications   carbamide peroxide (DEBROX) 6.5 % OTIC solution    Sig: Place 5 drops into the right ear 2 (two) times daily for 4 days.    Dispense:  15 mL    Refill:  0     Follow up: 6 months for routine   Remember: to use the debrox ear drops for up to 4 days- do not use it more than 4 days  Should you have any questions or concerns please call the internal medicine clinic at [phone].     Buddy Duty, D.O. [place]

## 2021-09-15 NOTE — Assessment & Plan Note (Signed)
Received flu shot in October at her [facility] pharmacy.  Referral placed for colonoscopy.  Patient will make an appt for a pap smear- deferred today

## 2021-09-15 NOTE — Assessment & Plan Note (Signed)
Patient presented with the feeling that her left ear has been plugged/full for the last 2 weeks. She first noticed this a couple of times after her shower, and notes that over the last 3-4 days, the feeling has been constant. She denies any ear pain, ear discharge, hearing loss, tinnitus, or any other sxs at this time. She states that besides this, she feels generally well. On exam, her R ear and TM are normal, her L ear is significantly impacted with cerumen. Disimpacted cerumen today in the clinic and significant cerumen was removed, allowing the patient to immediately feel relief. Also sent in debrox ear drops to use if this occurs again, but advised the patient to not use these drops for more than 4 days.   Plan: - Cerumen disimpaction performed today - Debrox ear drops sent to pharmacy

## 2021-09-15 NOTE — Progress Notes (Signed)
° °  CC: ear fullness  HPI:  [name] is a 46 y.o. female with pre-diabetes and onychomycosis who presents to the [facility] for left ear fullness. Please see problem-based list for further details, assessments, and plans.   Past Medical History:  Diagnosis Date   GERD (gastroesophageal reflux disease)    Low back pain    Review of Systems:  Review of Systems  Constitutional:  Negative for chills and fever.  HENT:  Negative for congestion, ear discharge, ear pain, hearing loss and tinnitus.        L ear fullness/plugged  Eyes:  Negative for blurred vision and double vision.  Respiratory:  Negative for cough and shortness of breath.   Cardiovascular:  Negative for chest pain and palpitations.  Gastrointestinal:  Negative for diarrhea, nausea and vomiting.  Musculoskeletal: Negative.     Physical Exam:  Vitals:   09/15/21 1424  BP: (!) 88/50  Pulse: 67  Temp: 98.2 F (36.8 C)  TempSrc: Oral  SpO2: 100%  Weight: 141 lb 14.4 oz (64.4 kg)   General: Pleasant, well-appearing female. No acute distress. HENT: Left ear with cerumen impaction.  CV: RRR. No murmurs. No LE edema Pulmonary: Lungs CTAB. Normal effort.  Skin: Warm and dry. No obvious rash or lesions. Neuro: A&Ox3. Moves all extremities. Normal sensation. No focal deficit. Psych: Normal mood and affect   Assessment & Plan:   See Encounters Tab for problem based charting.  Patient discussed with Dr.  Cain Sieve

## 2021-09-16 NOTE — Progress Notes (Signed)
Internal Medicine Clinic Attending ° °Case discussed with Dr. Atway  At the time of the visit.  We reviewed the resident’s history and exam and pertinent patient test results.  I agree with the assessment, diagnosis, and plan of care documented in the resident’s note.  °

## 2021-10-07 DIAGNOSIS — Z1231 Encounter for screening mammogram for malignant neoplasm of breast: Secondary | ICD-10-CM | POA: Diagnosis not present

## 2021-10-08 ENCOUNTER — Encounter: Payer: Self-pay | Admitting: Internal Medicine

## 2021-10-08 ENCOUNTER — Ambulatory Visit: Payer: BC Managed Care – PPO | Admitting: Internal Medicine

## 2021-10-08 ENCOUNTER — Other Ambulatory Visit: Payer: Self-pay

## 2021-10-08 ENCOUNTER — Telehealth: Payer: Self-pay | Admitting: *Deleted

## 2021-10-08 VITALS — BP 96/62 | HR 68 | Temp 98.4°F | Resp 28 | Ht 62.0 in | Wt 137.2 lb

## 2021-10-08 DIAGNOSIS — R1012 Left upper quadrant pain: Secondary | ICD-10-CM | POA: Diagnosis not present

## 2021-10-08 MED ORDER — FAMOTIDINE 20 MG PO TABS: 20.0000 mg | ORAL_TABLET | Freq: Two times a day (BID) | ORAL | 0 refills | Status: DC | PRN

## 2021-10-08 MED ORDER — OMEPRAZOLE 40 MG PO CPDR: 40.0000 mg | DELAYED_RELEASE_CAPSULE | Freq: Every day | ORAL | 0 refills | Status: DC

## 2021-10-08 NOTE — Assessment & Plan Note (Signed)
>>  ASSESSMENT AND PLAN FOR LUQ ABDOMINAL PAIN WRITTEN ON 10/08/2021 10:00 AM BY Verdene Lennert, MD  [name] states she has been experiencing LUQ abdominal pain for the past 48 hours after it started quickly in the PM of 3/21. She states it is predominantly located in the LUQ but does occasionally radiate down and to the right. Pain seems to be triggered by food but occurs without food as well. She endorses associated low grade fever, chills, body aches, sour taste in the her mouth and nausea. She denies any vomiting, diarrhea, melena, hematochezia.    She notes a similar event occurred in December 2022, however at that time, her fevers were much higher and the pain was predominantly epigastric without radiation.   [name] wonders if both events were triggered by eating spicy food.   Assessment/Plan:  Abdominal examination only remarkable for minimal TTP in the epigastric and LUQ abdomen but no guarding or distention. Absolutely no pain in the RUQ.   Differential includes gastritis vs GERD, however would be unusual to experience fever/chills/body aches unless this is infectious in nature. No out of country travel but differential includes H. Pylori given repetitive nature of symptoms. Lastly, differential includes pancreatitis versus atypical presentation of biliary colic.   - Start work up with CBC, CMP, lipase, beta hcg, and H. Pylori stool antigen test  - If LFTs are elevated, will order RUQ ultrasound - Once stool antigen test is obtained, recommend daily PPI with PRN H2B - Follow up in 1-2 weeks if pain does not improve. Otherwise, follow up in 4-6 weeks with PCP

## 2021-10-08 NOTE — Assessment & Plan Note (Addendum)
[name] states she has been experiencing LUQ abdominal pain for the past 48 hours after it started quickly in the PM of 3/21. She states it is predominantly located in the LUQ but does occasionally radiate down and to the right. Pain seems to be triggered by food but occurs without food as well. She endorses associated low grade fever, chills, body aches, sour taste in the her mouth and nausea. She denies any vomiting, diarrhea, melena, hematochezia.   ? ?She notes a similar event occurred in December 2022, however at that time, her fevers were much higher and the pain was predominantly epigastric without radiation.  ? ?[name] wonders if both events were triggered by eating spicy food.  ? ?Assessment/Plan:  ?Abdominal examination only remarkable for minimal TTP in the epigastric and LUQ abdomen but no guarding or distention. Absolutely no pain in the RUQ.  ? ?Differential includes gastritis vs GERD, however would be unusual to experience fever/chills/body aches unless this is infectious in nature. No out of country travel but differential includes H. Pylori given repetitive nature of symptoms. Lastly, differential includes pancreatitis versus atypical presentation of biliary colic.  ? ?- Start work up with CBC, CMP, lipase, beta hcg, and H. Pylori stool antigen test  ?- If LFTs are elevated, will order RUQ ultrasound ?- Once stool antigen test is obtained, recommend daily PPI with PRN H2B ?- Follow up in 1-2 weeks if pain does not improve. Otherwise, follow up in 4-6 weeks with PCP ?

## 2021-10-08 NOTE — Addendum Note (Signed)
Addended by: Jose Persia on: 10/08/2021 02:16 PM ? ? Modules accepted: Orders ? ?

## 2021-10-08 NOTE — Progress Notes (Signed)
? ?  CC: LUQ abdominal pain ? ?HPI: ? ?[name] is a 46 y.o. with a PMHx as listed below who presents to the clinic for LUQ abdominal pain.  ? ?Please see the Encounters tab for problem-based Assessment & Plan regarding status of patient's acute and chronic conditions. ? ?Past Medical History:  ?Diagnosis Date  ? GERD (gastroesophageal reflux disease)   ? Low back pain   ? ?Review of Systems: Review of Systems  ?Constitutional:  Positive for chills, fever and malaise/fatigue.  ?HENT:  Negative for congestion, ear discharge, sinus pain and sore throat.   ?Respiratory:  Negative for cough, shortness of breath and wheezing.   ?Cardiovascular:  Negative for chest pain, palpitations and leg swelling.  ?Gastrointestinal:  Positive for abdominal pain, constipation and nausea. Negative for blood in stool, diarrhea, melena and vomiting.  ?Genitourinary:  Negative for dysuria, frequency, hematuria and urgency.  ?Musculoskeletal:  Positive for myalgias (generalized).  ?Skin:  Negative for itching and rash.  ?Neurological:  Negative for dizziness, sensory change, focal weakness, weakness and headaches.  ? ?Physical Exam: ? ?Vitals:  ? 10/08/21 0847 10/08/21 0850  ?BP: (!) 89/56 96/62  ?Pulse: 69 68  ?Resp: (!) 28   ?Temp: 98.4 ?F (36.9 ?C)   ?SpO2: 100%   ?Weight: 137 lb 3.2 oz (62.2 kg)   ?Height: '5\' 2"'$  (1.575 m)   ? ?Physical Exam ?Vitals and nursing note reviewed.  ?Constitutional:   ?   General: She is not in acute distress. ?   Appearance: She is normal weight.  ?HENT:  ?   Head: Normocephalic and atraumatic.  ?Eyes:  ?   Conjunctiva/sclera: Conjunctivae normal.  ?   Pupils: Pupils are equal, round, and reactive to light.  ?Cardiovascular:  ?   Rate and Rhythm: Normal rate and regular rhythm.  ?   Heart sounds: No murmur heard. ?  No gallop.  ?Pulmonary:  ?   Effort: Pulmonary effort is normal. No respiratory distress.  ?   Breath sounds: No wheezing, rhonchi or rales.  ?Abdominal:  ?   General: Bowel sounds  are decreased. There is no distension.  ?   Palpations: Abdomen is soft. There is no hepatomegaly or mass.  ?   Tenderness: There is abdominal tenderness (minimal TTP) in the epigastric area and left upper quadrant. There is no right CVA tenderness, left CVA tenderness, guarding or rebound. Negative signs include Murphy's sign.  ?   Hernia: No hernia is present.  ?Musculoskeletal:  ?   Right lower leg: No edema.  ?   Left lower leg: No edema.  ?Skin: ?   General: Skin is warm and dry.  ?Neurological:  ?   General: No focal deficit present.  ?   Mental Status: She is alert and oriented to person, place, and time. Mental status is at baseline.  ?Psychiatric:     ?   Mood and Affect: Mood normal.     ?   Behavior: Behavior normal.     ?   Thought Content: Thought content normal.     ?   Judgment: Judgment normal.  ? ? ?Assessment & Plan:  ? ?See Encounters Tab for problem based charting. ? ?Patient discussed with Dr.  Saverio Danker ? ?

## 2021-10-08 NOTE — Telephone Encounter (Signed)
Patient called in stating she cannot afford OTC omeprazole or famotidine. Requesting these be sent as Rxs to [facility] on [address]. ?

## 2021-10-08 NOTE — Patient Instructions (Addendum)
It was nice seeing you today! Thank you for choosing [facility] for your Primary Care.  ?  ?Today we talked about:  ? ?Stomach Pain: I suspect this is inflammation of your stomach, but I would like to investigate further with blood work and a stool test.  ?Please bring back the stool test as your earliest convenience ?Once you have tested your stool, the following medications can be helpful to relieve your pain:  ?Omeprazole (Prilosec) 40 mg once daily. Take 1 hour before you eat in the morning ?Famotidine (Pepcid). Take 1 tablet as needed if your pain continues  ?Avoid spicy food until your pain is completely resolved  ? ?I will call you with your results.  ? ?Please follow up in 1-2 weeks IF you pain does not resolve. If it does, follow up in 4-6 weeks.  ?

## 2021-10-10 LAB — CBC WITH DIFFERENTIAL/PLATELET
Basophils Absolute: 0 10*3/uL (ref 0.0–0.2)
Basos: 1 %
EOS (ABSOLUTE): 0.1 10*3/uL (ref 0.0–0.4)
Eos: 1 %
Hematocrit: 38.5 % (ref 34.0–46.6)
Hemoglobin: 13.5 g/dL (ref 11.1–15.9)
Immature Grans (Abs): 0 10*3/uL (ref 0.0–0.1)
Immature Granulocytes: 0 %
Lymphocytes Absolute: 1.5 10*3/uL (ref 0.7–3.1)
Lymphs: 34 %
MCH: 32.1 pg (ref 26.6–33.0)
MCHC: 35.1 g/dL (ref 31.5–35.7)
MCV: 91 fL (ref 79–97)
Monocytes Absolute: 0.4 10*3/uL (ref 0.1–0.9)
Monocytes: 8 %
Neutrophils Absolute: 2.5 10*3/uL (ref 1.4–7.0)
Neutrophils: 56 %
Platelets: 142 10*3/uL — ABNORMAL LOW (ref 150–450)
RBC: 4.21 x10E6/uL (ref 3.77–5.28)
RDW: 12.6 % (ref 11.7–15.4)
WBC: 4.6 10*3/uL (ref 3.4–10.8)

## 2021-10-10 LAB — CMP14 + ANION GAP
ALT: 16 IU/L (ref 0–32)
AST: 21 IU/L (ref 0–40)
Albumin/Globulin Ratio: 1.7 (ref 1.2–2.2)
Albumin: 4.4 g/dL (ref 3.8–4.8)
Alkaline Phosphatase: 93 IU/L (ref 44–121)
Anion Gap: 16 mmol/L (ref 10.0–18.0)
BUN/Creatinine Ratio: 19 (ref 9–23)
BUN: 11 mg/dL (ref 6–24)
Bilirubin Total: 0.3 mg/dL (ref 0.0–1.2)
CO2: 24 mmol/L (ref 20–29)
Calcium: 9.4 mg/dL (ref 8.7–10.2)
Chloride: 103 mmol/L (ref 96–106)
Creatinine, Ser: 0.59 mg/dL (ref 0.57–1.00)
Globulin, Total: 2.6 g/dL (ref 1.5–4.5)
Glucose: 66 mg/dL — ABNORMAL LOW (ref 70–99)
Potassium: 3.5 mmol/L (ref 3.5–5.2)
Sodium: 143 mmol/L (ref 134–144)
Total Protein: 7 g/dL (ref 6.0–8.5)
eGFR: 112 mL/min/{1.73_m2} (ref >59)

## 2021-10-10 LAB — LIPASE: Lipase: 22 U/L (ref 14–72)

## 2021-10-10 LAB — BETA HCG QUANT (REF LAB): hCG Quant: 2 m[IU]/mL

## 2021-10-11 LAB — H. PYLORI ANTIGEN, STOOL: H pylori Ag, Stl: NEGATIVE

## 2021-10-12 NOTE — Progress Notes (Signed)
Internal Medicine Clinic Attending  Case discussed with Dr. Basaraba  At the time of the visit.  We reviewed the resident's history and exam and pertinent patient test results.  I agree with the assessment, diagnosis, and plan of care documented in the resident's note.  

## 2021-10-12 NOTE — Progress Notes (Signed)
CBC with platelets just barely below normal limits. Unclear on the clinical relevance in this setting. CMP with no hepatic or renal abnormalities. Lipase negative. H. Pylori stool antigen negative.  ? ?Contacted [name] who states her symptoms are already improving. She has yet to start Omeprazole. She is picking it up today.  ? ?Follow up visit scheduled to re-assess symptoms.

## 2021-10-20 ENCOUNTER — Other Ambulatory Visit (HOSPITAL_COMMUNITY)
Admission: RE | Admit: 2021-10-20 | Discharge: 2021-10-20 | Disposition: A | Discharge status: Home / Self Care | Payer: BC Managed Care – PPO | Source: Ambulatory Visit | Attending: Internal Medicine | Admitting: Internal Medicine

## 2021-10-20 ENCOUNTER — Encounter: Payer: Self-pay | Admitting: Internal Medicine

## 2021-10-20 ENCOUNTER — Ambulatory Visit: Payer: BC Managed Care – PPO | Admitting: Internal Medicine

## 2021-10-20 VITALS — BP 93/47 | HR 66 | Temp 98.2°F | Ht 62.0 in | Wt 137.8 lb

## 2021-10-20 DIAGNOSIS — R87810 Cervical high risk human papillomavirus (HPV) DNA test positive: Secondary | ICD-10-CM

## 2021-10-20 DIAGNOSIS — Z124 Encounter for screening for malignant neoplasm of cervix: Secondary | ICD-10-CM | POA: Diagnosis not present

## 2021-10-20 DIAGNOSIS — Z1211 Encounter for screening for malignant neoplasm of colon: Secondary | ICD-10-CM

## 2021-10-20 DIAGNOSIS — K219 Gastro-esophageal reflux disease without esophagitis: Secondary | ICD-10-CM

## 2021-10-20 DIAGNOSIS — Z0000 Encounter for general adult medical examination without abnormal findings: Secondary | ICD-10-CM

## 2021-10-20 NOTE — Assessment & Plan Note (Signed)
Patient reportedly had an abnormal Pap smear back in Sept 2013 that was negative for intraepithelial lesion or malignancy (NIL M) but did have high risk HPV detected. Patient has been overdue for a pap smear and we performed this today - will call patient with results.  ?

## 2021-10-20 NOTE — Assessment & Plan Note (Addendum)
Patient states that she was told she has GERD and was prescribed omeprazole 40 mg bid at her last visit in March. She states that she has been using this medicine about once a day and it has been helping her symptoms. She still has some heartburn but otherwise denies any abd pain, n/v/d.  ? ?Plan: ?- Omeprazole 40 mg bid ?

## 2021-10-20 NOTE — Progress Notes (Signed)
? ?  CC: pap smear ? ?HPI: ? ?Kelly Atkinson is a 46 y.o. female with pre-diabetes and onychomycosis who presents to the [facility] for a pap smear and preventative visit. Please see problem-based list for further details, assessments, and plans. ? ? ?Past Medical History:  ?Diagnosis Date  ? GERD (gastroesophageal reflux disease)   ? Low back pain   ? ?Review of Systems:  Review of Systems  ?Constitutional:  Negative for chills and fever.  ?HENT: Negative.    ?Respiratory: Negative.    ?Cardiovascular:  Negative for chest pain and palpitations.  ?Gastrointestinal:  Positive for heartburn. Negative for constipation, diarrhea, nausea and vomiting.  ?Genitourinary:  Negative for dysuria and frequency.  ?Musculoskeletal: Negative.   ?Neurological:  Negative for dizziness and headaches.  ? ? ?Physical Exam: ? ?Vitals:  ? 10/20/21 1440  ?BP: (!) 93/47  ?Pulse: 66  ?Temp: 98.2 ?F (36.8 ?C)  ?TempSrc: Oral  ?SpO2: 100%  ?Weight: 137 lb 12.8 oz (62.5 kg)  ?Height: '5\' 2"'$  (1.575 m)  ? ?General: Pleasant, well-appearing female. No acute distress. ?CV: RRR. No murmurs. No LE edema ?Pulmonary: Lungs CTAB. Normal effort. No wheezing or rales. ?Abdominal: Soft, nontender, nondistended. Normal bowel sounds. ?Extremities: Palpable radial and DP pulses.  ?Skin: Warm and dry. ?GU: Pelvic exam: normal external genitalia, vulva, vagina, cervix, and uterus  VAGINA: normal appearing vagina with normal color and discharge, no lesions, CERVIX: normal appearing cervix without discharge or lesions. ?Neuro: A&Ox3.  No focal deficit. ?Psych: Normal mood and affect ? ? ?Assessment & Plan:  ? ?See Encounters Tab for problem based charting. ? ?Patient discussed with Dr. Philipp Ovens ? ?

## 2021-10-20 NOTE — Assessment & Plan Note (Signed)
Patient declining colonoscopy - gave FIT test in place. ? ?Pap smear performed today ?

## 2021-10-20 NOTE — Patient Instructions (Signed)
Thank you, Ms.Tachina Silva-Gutierrez for allowing [name] to provide your care today. Today we discussed: ? ?Pap smear: We did your pap smear today and I will call you once we have those results (should be later this week)! ? ?I have ordered the following labs for you: ? ?Lab Orders  ?No laboratory test(s) ordered today  ?  ? ?Tests ordered today: ? ?none ? ?Referrals ordered today:  ? ?Referral Orders  ?No referral(s) requested today  ?  ? ?I have ordered the following medication/changed the following medications:  ? ?Stop the following medications: ?There are no discontinued medications.  ? ?Start the following medications: ?No orders of the defined types were placed in this encounter. ?  ? ?Follow up: 6 months routine visit ? ? ? ?Should you have any questions or concerns please call the internal medicine clinic at [phone].   ? ? ?Buddy Duty, D.O. ?[place] ? ? ?

## 2021-10-22 LAB — CYTOLOGY - PAP
Adequacy: ABSENT
Comment: NEGATIVE
Diagnosis: NEGATIVE
High risk HPV: NEGATIVE

## 2021-10-26 NOTE — Progress Notes (Signed)
Internal Medicine Clinic Attending ° °Case discussed with Dr. Atway  At the time of the visit.  We reviewed the resident’s history and exam and pertinent patient test results.  I agree with the assessment, diagnosis, and plan of care documented in the resident’s note.  °

## 2021-11-24 ENCOUNTER — Other Ambulatory Visit: Payer: BC Managed Care – PPO

## 2021-11-24 DIAGNOSIS — Z1211 Encounter for screening for malignant neoplasm of colon: Secondary | ICD-10-CM

## 2021-11-25 LAB — FECAL OCCULT BLOOD, IMMUNOCHEMICAL: Fecal Occult Bld: NEGATIVE

## 2022-01-27 ENCOUNTER — Ambulatory Visit (AMBULATORY_SURGERY_CENTER): Payer: Self-pay | Admitting: *Deleted

## 2022-01-27 VITALS — Ht 63.0 in | Wt 138.0 lb

## 2022-01-27 DIAGNOSIS — Z1211 Encounter for screening for malignant neoplasm of colon: Secondary | ICD-10-CM

## 2022-01-27 MED ORDER — NA SULFATE-K SULFATE-MG SULF 17.5-3.13-1.6 GM/177ML PO SOLN: 1.0000 | Freq: Once | ORAL | 0 refills | Status: AC

## 2022-01-27 NOTE — Progress Notes (Signed)
No egg or soy allergy known to patient  No issues known to pt with past sedation with any surgeries or procedures Patient denies ever being told they had issues or difficulty with intubation  No FH of Malignant Hyperthermia Pt is not on diet pills Pt is not on  home 02  Pt is not on blood thinners  Pt denies issues with constipation  No A fib or A flutter Have any cardiac testing pending--pt denies  Pt instructed to use GoodRx for a price reduction on prep  Interpreter with pt today in [place]-

## 2022-02-04 ENCOUNTER — Encounter: Payer: Self-pay | Admitting: Gastroenterology

## 2022-02-10 ENCOUNTER — Ambulatory Visit (AMBULATORY_SURGERY_CENTER): Payer: BC Managed Care – PPO | Admitting: Gastroenterology

## 2022-02-10 ENCOUNTER — Encounter: Payer: Self-pay | Admitting: Gastroenterology

## 2022-02-10 VITALS — BP 99/60 | HR 57 | Temp 98.0°F | Resp 11 | Ht 62.0 in | Wt 138.0 lb

## 2022-02-10 DIAGNOSIS — Z1211 Encounter for screening for malignant neoplasm of colon: Secondary | ICD-10-CM

## 2022-02-10 MED ORDER — SODIUM CHLORIDE 0.9 % IV SOLN: 500.0000 mL | Freq: Once | INTRAVENOUS | Status: DC

## 2022-02-10 NOTE — Op Note (Signed)
[facility] Patient Name: Kelly Atkinson Procedure Date: 02/10/2022 9:18 AM MRN: [id number] Endoscopist: Milus Banister , MD Age: 46 Referring MD:  Date of Birth: 12/22/1975 Gender: Female Account #: [id number] Procedure:                Colonoscopy Indications:              Screening for colorectal malignant neoplasm Medicines:                Monitored Anesthesia Care Procedure:                Pre-Anesthesia Assessment:                           - Prior to the procedure, a History and Physical                            was performed, and patient medications and                            allergies were reviewed. The patient's tolerance of                            previous anesthesia was also reviewed. The risks                            and benefits of the procedure and the sedation                            options and risks were discussed with the patient.                            All questions were answered, and informed consent                            was obtained. Prior Anticoagulants: The patient has                            taken no previous anticoagulant or antiplatelet                            agents. ASA Grade Assessment: II - A patient with                            mild systemic disease. After reviewing the risks                            and benefits, the patient was deemed in                            satisfactory condition to undergo the procedure.                           After obtaining informed consent, the colonoscope  was passed under direct vision. Throughout the                            procedure, the patient's blood pressure, pulse, and                            oxygen saturations were monitored continuously. The                            CF [id number] #[id number] was introduced through the anus                            and advanced to the the cecum, identified by                            appendiceal  orifice and ileocecal valve. The                            colonoscopy was performed without difficulty. The                            patient tolerated the procedure well. The quality                            of the bowel preparation was good. The ileocecal                            valve, appendiceal orifice, and rectum were                            photographed. Scope In: 9:23:28 AM Scope Out: 9:33:28 AM Scope Withdrawal Time: 0 hours 7 minutes 18 seconds  Total Procedure Duration: 0 hours 10 minutes 0 seconds  Findings:                 The entire examined colon appeared normal on direct                            and retroflexion views. Complications:            No immediate complications. Estimated blood loss:                            None. Estimated Blood Loss:     Estimated blood loss: none. Impression:               - The entire examined colon is normal on direct and                            retroflexion views.                           - No polyps or cancers. Recommendation:           - Patient has a contact number available for  emergencies. The signs and symptoms of potential                            delayed complications were discussed with the                            patient. Return to normal activities tomorrow.                            Written discharge instructions were provided to the                            patient.                           - Resume previous diet.                           - Continue present medications.                           - Repeat colonoscopy in 10 years for screening. Milus Banister, MD 02/10/2022 9:35:23 AM This report has been signed electronically.

## 2022-02-10 NOTE — Progress Notes (Signed)
To pacu, VSS. Report to [email or web address] 

## 2022-02-10 NOTE — Progress Notes (Signed)
HPI: This is a woman at routine risk for [facility]  Not sure why, but she had FOB testing recently and it was negative (overscreening?)   ROS: complete GI ROS as described in HPI, all other review negative.  Constitutional:  No unintentional weight loss   Past Medical History:  Diagnosis Date   Anemia    Asthma    as a child   GERD (gastroesophageal reflux disease)    Low back pain    Low blood pressure    Pre-diabetes     Past Surgical History:  Procedure Laterality Date   radio surgery     to uterus   WISDOM TOOTH EXTRACTION      Current Outpatient Medications  Medication Sig Dispense Refill   Multiple Vitamin (MULTIVITAMIN) tablet Take 1 tablet by mouth daily.     Probiotic Product (PROBIOTIC DAILY PO) Take by mouth.     ciclopirox (PENLAC) 8 % solution Apply topically at bedtime. Apply over nail and surrounding skin. Apply daily over previous coat. After seven (7) days, may remove with alcohol and continue cycle. (Patient not taking: Reported on 01/27/2022) 6.6 mL 0   famotidine (PEPCID) 20 MG tablet Take 1 tablet (20 mg total) by mouth 2 (two) times daily as needed for heartburn or indigestion (or stomach pain). (Patient not taking: Reported on 01/27/2022) 60 tablet 0   omeprazole (PRILOSEC) 40 MG capsule Take 1 capsule (40 mg total) by mouth daily. (Patient not taking: Reported on 01/27/2022) 30 capsule 0   Current Facility-Administered Medications  Medication Dose Route Frequency Provider Last Rate Last Admin   0.9 %  sodium chloride infusion  500 mL Intravenous Once Milus Banister, MD        Allergies as of 02/10/2022 - Review Complete 02/10/2022  Allergen Reaction Noted   Ibuprofen Anaphylaxis 03/05/2011    Family History  Problem Relation Age of Onset   Diabetes Mother    Hypertension Mother    Stomach cancer Mother    Stroke Father        Died    Colon cancer Neg Hx    Colon polyps Neg Hx    Esophageal cancer Neg Hx    Rectal cancer Neg Hx     Social  History   Socioeconomic History   Marital status: Single    Spouse name: Not on file   Number of children: 2   Years of education: Not on file   Highest education level: Not on file  Occupational History   Occupation: Chartered certified accountant: Medical sales representative   Occupation: Museum/gallery curator: Medical sales representative  Tobacco Use   Smoking status: Never   Smokeless tobacco: Never  Scientific laboratory technician Use: Never used  Substance and Sexual Activity   Alcohol use: No    Alcohol/week: 0.0 standard drinks of alcohol   Drug use: No   Sexual activity: Not Currently  Other Topics Concern   Not on file  Social History Narrative   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  She is standing and does not have to slouch or bend her back while working.  She has been working at [employer] for 4 years.   Social Determinants of Health   Financial Resource Strain: Not on file  Food Insecurity: Not on file  Transportation Needs: Not on file  Physical Activity: Not on file  Stress: Not on file  Social Connections: Not on file  Intimate Partner Violence: Not on file     Physical Exam: BP 100/71   Pulse 64   Temp 98 F (36.7 C) (Skin)   Ht '5\' 2"'$  (1.575 m)   Wt 138 lb (62.6 kg)   SpO2 100%   BMI 25.24 kg/m  Constitutional: generally well-appearing Psychiatric: alert and oriented x3 Lungs: CTA bilaterally Heart: no MCR  Assessment and plan: 46 y.o. female with routine risk for CRC  Screening colonoscopy today.  Care is appropriate for the ambulatory setting.  Owens Loffler, MD [facility] 02/10/2022, 9:14 AM

## 2022-02-10 NOTE — Progress Notes (Signed)
Interpreter used today at the [facility] for this pt.  Interpreter's name is- Janith Lima  Cell phone off per pt

## 2022-02-10 NOTE — Patient Instructions (Addendum)
YOU HAD AN ENDOSCOPIC PROCEDURE TODAY AT THE  ENDOSCOPY CENTER:   Refer to the procedure report that was given to you for any specific questions about what was found during the examination.  If the procedure report does not answer your questions, please call your gastroenterologist to clarify.  If you requested that your care partner not be given the details of your procedure findings, then the procedure report has been included in a sealed envelope for you to review at your convenience later.  YOU SHOULD EXPECT: Some feelings of bloating in the abdomen. Passage of more gas than usual.  Walking can help get rid of the air that was put into your GI tract during the procedure and reduce the bloating. If you had a lower endoscopy (such as a colonoscopy or flexible sigmoidoscopy) you may notice spotting of blood in your stool or on the toilet paper. If you underwent a bowel prep for your procedure, you may not have a normal bowel movement for a few days.  Please Note:  You might notice some irritation and congestion in your nose or some drainage.  This is from the oxygen used during your procedure.  There is no need for concern and it should clear up in a day or so.  SYMPTOMS TO REPORT IMMEDIATELY:  Following lower endoscopy (colonoscopy or flexible sigmoidoscopy):  Excessive amounts of blood in the stool  Significant tenderness or worsening of abdominal pains  Swelling of the abdomen that is new, acute  Fever of 100F or higher  Following upper endoscopy (EGD)  Vomiting of blood or coffee ground material  New chest pain or pain under the shoulder blades  Painful or persistently difficult swallowing  New shortness of breath  Fever of 100F or higher  Black, tarry-looking stools  For urgent or emergent issues, a gastroenterologist can be reached at any hour by calling [phone]. Do not use MyChart messaging for urgent concerns.    DIET:  We do recommend a small meal at first, but  then you may proceed to your regular diet.  Drink plenty of fluids but you should avoid alcoholic beverages for 24 hours.  ACTIVITY:  You should plan to take it easy for the rest of today and you should NOT DRIVE or use heavy machinery until tomorrow (because of the sedation medicines used during the test).    FOLLOW UP: Our staff will call the number listed on your records the next business day following your procedure.  We will call around 7:15- 8:00 am to check on you and address any questions or concerns that you may have regarding the information given to you following your procedure. If we do not reach you, we will leave a message.  If you develop any symptoms (ie: fever, flu-like symptoms, shortness of breath, cough etc.) before then, please call [phone].  If you test positive for Covid 19 in the 2 weeks post procedure, please call and report this information to us.    If any biopsies were taken you will be contacted by phone or by letter within the next 1-3 weeks.  Please call us at [phone] if you have not heard about the biopsies in 3 weeks.    SIGNATURES/CONFIDENTIALITY: You and/or your care partner have signed paperwork which will be entered into your electronic medical record.  These signatures attest to the fact that that the information above on your After Visit Summary has been reviewed and is understood.  Full responsibility of the confidentiality   of this discharge information lies with you and/or your care-partner. USTED TUVO UN PROCEDIMIENTO ENDOSCPICO HOY EN EL [facility]:   Lea el informe del procedimiento que se le entreg para cualquier pregunta especfica sobre lo que se Primary school teacher.  Si el informe del examen no responde a sus preguntas, por favor llame a su gastroenterlogo para aclararlo.  Si usted solicit que no se le den [employer] de lo que se Estate manager/land agent en su procedimiento al [employer] va a cuidar, entonces el informe del  procedimiento se ha incluido en un sobre sellado para que usted lo revise despus cuando le sea ms conveniente.   LO QUE PUEDE ESPERAR: Algunas sensaciones de hinchazn en el abdomen.  Puede tener ms gases de lo normal.  El caminar puede ayudarle a eliminar el aire que se le puso en el tracto gastrointestinal durante el procedimiento y reducir la hinchazn.  Si le hicieron una endoscopia inferior (como una colonoscopia o una sigmoidoscopia flexible), podra notar manchas de sangre en las heces fecales o en el papel higinico.  Si se someti a una preparacin intestinal para su procedimiento, es posible que no tenga una evacuacin intestinal normal durante [employer].   Tenga en cuenta:  Es posible que note un poco de irritacin y congestin en la nariz o algn drenaje.  Esto es debido al oxgeno [employer] durante su procedimiento.  No hay que preocuparse y esto debe desaparecer ms o Scientist, research (medical).   SNTOMAS PARA REPORTAR INMEDIATAMENTE:  Despus de una endoscopia inferior (colonoscopia o sigmoidoscopia flexible):  Cantidades excesivas de sangre en las heces fecales  Sensibilidad significativa o empeoramiento de los dolores abdominales   Hinchazn aguda del abdomen que antes no tena   Fiebre de 100F o ms   Despus de la endoscopia superior (EGD)  Vmitos de Biochemist, clinical o material como caf molido   Dolor en el pecho o dolor debajo de los omplatos que antes no tena   Dolor o dificultad persistente para tragar  Falta de aire que antes no tena   Fiebre de 100F o ms  Heces fecales negras y pegajosas   Para asuntos urgentes o de Freight forwarder, puede comunicarse con un gastroenterlogo a cualquier hora llamando al [phone].  DIETA:  Recomendamos una comida pequea al principio, pero luego puede continuar con su dieta normal.  Tome muchos lquidos, Teacher, adult education las bebidas alcohlicas durante 24 horas.    ACTIVIDAD:  Debe planear tomarse las cosas con calma por el resto del da y  no debe CONDUCIR ni usar maquinaria pesada Programmer, applications (debido a los medicamentos de sedacin utilizados durante el examen).     SEGUIMIENTO: Nuestro personal llamar al nmero que aparece en su historial al siguiente da hbil de su procedimiento para ver cmo se siente y para responder cualquier pregunta o inquietud que pueda tener con respecto a la informacin que se le dio despus del procedimiento. Si no podemos contactarle, le dejaremos un mensaje.  Sin embargo, si se siente bien y no tiene Paediatric nurse, no es necesario que nos devuelva la llamada.  Asumiremos que ha regresado a sus actividades diarias normales sin incidentes. Si se le tomaron algunas biopsias, le contactaremos por telfono o por carta en las prximas 3 semanas.  Si no ha sabido [employer] biopsias en el transcurso de 3 semanas, por favor llmenos al [phone].   FIRMAS/CONFIDENCIALIDAD: Usted y/o el acompaante que le cuide han firmado documentos que se ingresarn  en su historial mdico electrnico.  Estas firmas atestiguan el hecho de que la informacin anterior

## 2022-02-11 ENCOUNTER — Telehealth: Payer: Self-pay

## 2022-02-11 NOTE — Telephone Encounter (Signed)
  Follow up Call-     02/10/2022    8:45 AM  Call back number  Post procedure Call Back phone  # [phone]- pt states she will understand our questions  Permission to leave phone message Yes     Patient questions:  Do you have a fever, pain , or abdominal swelling? No. Pain Score  0 *  Have you tolerated food without any problems? Yes.    Have you been able to return to your normal activities? Yes.    Do you have any questions about your discharge instructions: Diet   No. Medications  No. Follow up visit  No.  Do you have questions or concerns about your Care? No.  Actions: * If pain score is 4 or above: No action needed, pain <4.

## 2022-03-29 DIAGNOSIS — Z01419 Encounter for gynecological examination (general) (routine) without abnormal findings: Secondary | ICD-10-CM | POA: Diagnosis not present

## 2022-03-29 DIAGNOSIS — Z118 Encounter for screening for other infectious and parasitic diseases: Secondary | ICD-10-CM | POA: Diagnosis not present

## 2022-03-29 DIAGNOSIS — Z114 Encounter for screening for human immunodeficiency virus [HIV]: Secondary | ICD-10-CM | POA: Diagnosis not present

## 2022-03-29 DIAGNOSIS — Z6825 Body mass index (BMI) 25.0-25.9, adult: Secondary | ICD-10-CM | POA: Diagnosis not present

## 2022-03-29 DIAGNOSIS — Z113 Encounter for screening for infections with a predominantly sexual mode of transmission: Secondary | ICD-10-CM | POA: Diagnosis not present

## 2022-03-29 DIAGNOSIS — Z0142 Encounter for cervical smear to confirm findings of recent normal smear following initial abnormal smear: Secondary | ICD-10-CM | POA: Diagnosis not present

## 2022-03-29 DIAGNOSIS — Z1159 Encounter for screening for other viral diseases: Secondary | ICD-10-CM | POA: Diagnosis not present

## 2022-04-07 LAB — HM PAP SMEAR

## 2022-08-03 ENCOUNTER — Ambulatory Visit: Payer: BC Managed Care – PPO | Admitting: Physician Assistant

## 2022-08-03 ENCOUNTER — Encounter: Payer: Self-pay | Admitting: Physician Assistant

## 2022-08-03 VITALS — BP 90/60 | HR 69 | Ht 63.0 in | Wt 147.0 lb

## 2022-08-03 DIAGNOSIS — R1013 Epigastric pain: Secondary | ICD-10-CM

## 2022-08-03 DIAGNOSIS — R110 Nausea: Secondary | ICD-10-CM | POA: Diagnosis not present

## 2022-08-03 DIAGNOSIS — K219 Gastro-esophageal reflux disease without esophagitis: Secondary | ICD-10-CM | POA: Diagnosis not present

## 2022-08-03 MED ORDER — OMEPRAZOLE 40 MG PO CPDR: 40.0000 mg | DELAYED_RELEASE_CAPSULE | Freq: Every morning | ORAL | 3 refills | Status: DC

## 2022-08-03 NOTE — Progress Notes (Signed)
Chief Complaint: Epigastric pain  HPI:     Kelly Atkinson is a 47 year old Hispanic speaking female with a past medical history as listed below including GERD, known to Dr. Ardis Hughs, who was referred to me by Aloha Gell, MD for a complaint of epigastric pain.       02/10/2022 colonoscopy with entire colon normal, repeat recommended in 10 years.    Today, patient presents to clinic accompanied by an interpreter.  She explains that over the past 2 years she has had epigastric pain which seems to come and go and last for a day about once every month.  This is worse if she eats spicy foods so she discontinues these during times of pain and actually goes on a mostly liquid diet.  Even though the pain only lasts a day, she does feel like her stomach is inflamed and swollen for about 4 days and then it goes away on its own with a clear liquid diet.  Describes that she develops epigastric pain described as a burning/stabbing pain and sometimes feels like her bones hurt and develops chills and it often radiates down to her lower abdomen.  Rated as a 7-8/10. She will take a Tylenol and that seems to help a little bit.  Associated symptoms include some nausea but no vomiting.  Does discuss intermittent reflux symptoms in between these episodes and apparently years ago had an EGD showing gastritis and was started on Omeprazole which she uses 40 mg as needed.  Also feels this pain if she gets worried.  During these episodes also feels like she gets constipated and sometimes sees some mucus in her stool when she does pass one.  Again they seem to mostly pass on their own.    Denies fever, chills, weight loss, blood in her stool, vomiting or symptoms that awaken her from sleep.  Past Medical History:  Diagnosis Date   Anemia    Asthma    as a child   GERD (gastroesophageal reflux disease)    Low back pain    Low blood pressure    Pre-diabetes     Past Surgical History:  Procedure Laterality Date    radio surgery     to uterus   WISDOM TOOTH EXTRACTION      Current Outpatient Medications  Medication Sig Dispense Refill   ciclopirox (PENLAC) 8 % solution Apply topically at bedtime. Apply over nail and surrounding skin. Apply daily over previous coat. After seven (7) days, may remove with alcohol and continue cycle. 6.6 mL 0   famotidine (PEPCID) 20 MG tablet Take 1 tablet (20 mg total) by mouth 2 (two) times daily as needed for heartburn or indigestion (or stomach pain). 60 tablet 0   Multiple Vitamin (MULTIVITAMIN) tablet Take 1 tablet by mouth daily.     omeprazole (PRILOSEC) 40 MG capsule Take 1 capsule (40 mg total) by mouth daily. 30 capsule 0   Probiotic Product (PROBIOTIC DAILY PO) Take by mouth.     No current facility-administered medications for this visit.    Allergies as of 08/03/2022 - Review Complete 08/03/2022  Allergen Reaction Noted   Ibuprofen Anaphylaxis 03/05/2011    Family History  Problem Relation Age of Onset   Diabetes Mother    Hypertension Mother    Stomach cancer Mother    Stroke Father        Died    Colon cancer Neg Hx    Colon polyps Neg Hx    Esophageal cancer Neg  Hx    Rectal cancer Neg Hx     Social History   Socioeconomic History   Marital status: Single    Spouse name: Not on file   Number of children: 2   Years of education: Not on file   Highest education level: Not on file  Occupational History   Occupation: Chartered certified accountant: Medical sales representative   Occupation: Museum/gallery curator: Medical sales representative  Tobacco Use   Smoking status: Never   Smokeless tobacco: Never  Scientific laboratory technician Use: Never used  Substance and Sexual Activity   Alcohol use: No    Alcohol/week: 0.0 standard drinks of alcohol   Drug use: No   Sexual activity: Not Currently  Other Topics Concern   Not on file  Social History Narrative   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  She is standing and does not have to  slouch or bend her back while working.  She has been working at [employer] for 4 years.   Social Determinants of Health   Financial Resource Strain: Not on file  Food Insecurity: Not on file  Transportation Needs: Not on file  Physical Activity: Not on file  Stress: Not on file  Social Connections: Not on file  Intimate Partner Violence: Not on file    Review of Systems:    Constitutional: No weight loss or fever Cardiovascular: No chest pain Respiratory: No SOB  Gastrointestinal: See HPI and otherwise negative   Physical Exam:  Vital signs: BP 90/60   Pulse 69   Ht '5\' 3"'$  (1.6 m)   Wt 147 lb (66.7 kg)   BMI 26.04 kg/m    Constitutional:   Pleasant Hispanic female appears to be in NAD, Well developed, Well nourished, alert and cooperative Respiratory: Respirations even and unlabored. Lungs clear to auscultation bilaterally.   No wheezes, crackles, or rhonchi.  Cardiovascular: Normal S1, S2. No MRG. Regular rate and rhythm. No peripheral edema, cyanosis or pallor.  Gastrointestinal:  Soft, nondistended,mild epigastric ttp. No rebound or guarding. Normal bowel sounds. No appreciable masses or hepatomegaly. Rectal:  Not performed.  Psychiatric: Oriented to person, place and time. Demonstrates good judgement and reason without abnormal affect or behaviors.  RELEVANT LABS AND IMAGING: CBC    Component Value Date/Time   WBC 4.6 10/08/2021 0952   WBC 4.6 02/25/2014 0936   RBC 4.21 10/08/2021 0952   RBC 4.18 02/25/2014 0936   HGB 13.5 10/08/2021 0952   HCT 38.5 10/08/2021 0952   PLT 142 (L) 10/08/2021 0952   MCV 91 10/08/2021 0952   MCH 32.1 10/08/2021 0952   MCH 31.1 02/25/2014 0936   MCHC 35.1 10/08/2021 0952   MCHC 34.1 02/25/2014 0936   RDW 12.6 10/08/2021 0952   LYMPHSABS 1.5 10/08/2021 0952   MONOABS 0.4 02/25/2014 0936   EOSABS 0.1 10/08/2021 0952   BASOSABS 0.0 10/08/2021 0952    CMP     Component Value Date/Time   NA 143 10/08/2021 0952   K 3.5  10/08/2021 0952   CL 103 10/08/2021 0952   CO2 24 10/08/2021 0952   GLUCOSE 66 (L) 10/08/2021 0952   GLUCOSE 98 04/11/2013 1631   BUN 11 10/08/2021 0952   CREATININE 0.59 10/08/2021 0952   CREATININE 0.62 04/11/2013 1631   CALCIUM 9.4 10/08/2021 0952   PROT 7.0 10/08/2021 0952   ALBUMIN 4.4 10/08/2021 0952   AST 21 10/08/2021 0952   ALT  16 10/08/2021 0952   ALKPHOS 93 10/08/2021 0952   BILITOT 0.3 10/08/2021 0952   GFRNONAA 108 05/01/2019 1700   GFRNONAA >89 04/11/2013 1631   GFRAA 125 05/01/2019 1700   GFRAA >89 04/11/2013 1631    Assessment: 1.  Epigastric pain: Episodes that last for 1 day once a month with nausea and sometimes chills help by Tylenol, intermittent reflux in between, EGD "years ago" with gastritis, irritated by spicy foods and worry; likely gastritis 2.  GERD 3.  Nausea  Plan: 1.  Prescribed Omeprazole 40 mg daily, 30-60 minutes before breakfast, #30 with 3 refills.  Recommend she take this daily for the next 2 months at least and then follow-up with [name].  Hopefully she can titrate this down or off in the future pending results. 2.  Reviewed antireflux diet and lifestyle modifications. 3.  Patient to follow in clinic in 2 months with me or sooner if necessary.  Note sent to Dr. Rush Landmark today in lieu of Dr. Ardis Hughs absence.  Ellouise Newer, PA-C [facility] 08/03/2022, 3:01 PM  Cc: Aloha Gell, MD

## 2022-08-03 NOTE — Patient Instructions (Signed)
If you are age 49 or older, your body mass index should be between 23-30. Your Body mass index is 26.04 kg/m. If this is out of the aforementioned range listed, please consider follow up with your Primary Care Provider.  If you are age 71 or younger, your body mass index should be between 19-25. Your Body mass index is 26.04 kg/m. If this is out of the aformentioned range listed, please consider follow up with your Primary Care Provider.   We have sent the following medications to your pharmacy for you to pick up at your convenience: Omeprazole 40 mg 30-60 minutes before breakfast.  ________________________________________________________  The [facility] providers would like to encourage you to use [facility] to communicate with providers for non-urgent requests or questions.  Due to long hold times on the telephone, sending your provider a message by [facility] may be a faster and more efficient way to get a response.  Please allow 48 business hours for a response.  Please remember that this is for non-urgent requests.   It was a pleasure to see you today!  Thank you for trusting me with your gastrointestinal care!

## 2022-08-03 NOTE — Progress Notes (Signed)
Attending Physician's Attestation   I have reviewed the chart.   I agree with the Advanced Practitioner's note, impression, and recommendations with any updates as below. If issues persist post PPI trial, then H. pylori assessment and EGD should be considered.   Justice Britain, MD [facility] Advanced Endoscopy Office # [phone]

## 2022-10-04 ENCOUNTER — Ambulatory Visit: Payer: BC Managed Care – PPO | Admitting: Physician Assistant

## 2022-10-04 ENCOUNTER — Encounter: Payer: Self-pay | Admitting: Physician Assistant

## 2022-10-04 VITALS — BP 112/62 | HR 65 | Ht 62.0 in | Wt 150.0 lb

## 2022-10-04 DIAGNOSIS — K219 Gastro-esophageal reflux disease without esophagitis: Secondary | ICD-10-CM

## 2022-10-04 DIAGNOSIS — R1013 Epigastric pain: Secondary | ICD-10-CM

## 2022-10-04 DIAGNOSIS — R110 Nausea: Secondary | ICD-10-CM | POA: Diagnosis not present

## 2022-10-04 MED ORDER — OMEPRAZOLE 20 MG PO CPDR: 20.0000 mg | DELAYED_RELEASE_CAPSULE | Freq: Every day | ORAL | 11 refills | Status: DC

## 2022-10-04 MED ORDER — OMEPRAZOLE 20 MG PO CPDR: 20.0000 mg | DELAYED_RELEASE_CAPSULE | Freq: Every day | ORAL | 11 refills | Status: AC

## 2022-10-04 NOTE — Progress Notes (Signed)
Chief Complaint: Follow-up GERD and epigastric pain  HPI:    Kelly Atkinson is a 47 year old Hispanic speaking female with past medical history as listed below including GERD, known to Dr. Ardis Hughs and assigned to Dr. Rush Landmark now, with a past medical history of reflux, who presents to clinic today for follow-up of her epigastric pain and reflux symptoms.    02/10/2022 colonoscopy with entire colon normal, repeat recommended in 10 years.     08/03/2022 patient seen in clinic and at that time had trouble with epigastric pain nausea and reflux.  We discussed an EGD she had had years ago with gastritis.  Started her back on Omeprazole 40 mg daily.    Today, patient presents to clinic accompanied by interpreter and explains that she is completely better.  She has continued her Omeprazole 40 mg once daily and has no further epigastric pain or symptoms.  She has even missed a day or 2 of medicine here and there and has no trouble.    Denies fever, chills, weight loss, nausea or vomiting.  Past Medical History:  Diagnosis Date   Anemia    Asthma    as a child   GERD (gastroesophageal reflux disease)    Low back pain    Low blood pressure    Pre-diabetes     Past Surgical History:  Procedure Laterality Date   radio surgery     to uterus   WISDOM TOOTH EXTRACTION      Current Outpatient Medications  Medication Sig Dispense Refill   ciclopirox (PENLAC) 8 % solution Apply topically at bedtime. Apply over nail and surrounding skin. Apply daily over previous coat. After seven (7) days, may remove with alcohol and continue cycle. 6.6 mL 0   famotidine (PEPCID) 20 MG tablet Take 1 tablet (20 mg total) by mouth 2 (two) times daily as needed for heartburn or indigestion (or stomach pain). 60 tablet 0   Multiple Vitamin (MULTIVITAMIN) tablet Take 1 tablet by mouth daily.     omeprazole (PRILOSEC) 40 MG capsule Take 1 capsule (40 mg total) by mouth in the morning. 30 capsule 3   Probiotic  Product (PROBIOTIC DAILY PO) Take by mouth.     No current facility-administered medications for this visit.    Allergies as of 10/04/2022 - Review Complete 10/04/2022  Allergen Reaction Noted   Ibuprofen Anaphylaxis 03/05/2011    Family History  Problem Relation Age of Onset   Diabetes Mother    Hypertension Mother    Stomach cancer Mother    Stroke Father        Died    Colon cancer Neg Hx    Colon polyps Neg Hx    Esophageal cancer Neg Hx    Rectal cancer Neg Hx     Social History   Socioeconomic History   Marital status: Single    Spouse name: Not on file   Number of children: 2   Years of education: Not on file   Highest education level: Not on file  Occupational History   Occupation: Chartered certified accountant: Medical sales representative   Occupation: Museum/gallery curator: Medical sales representative  Tobacco Use   Smoking status: Never   Smokeless tobacco: Never  Scientific laboratory technician Use: Never used  Substance and Sexual Activity   Alcohol use: No    Alcohol/week: 0.0 standard drinks of alcohol   Drug use: No   Sexual activity: Not Currently  Other Topics Concern  Not on file  Social History Narrative   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  She is standing and does not have to slouch or bend her back while working.  She has been working at [employer] for 4 years.   Social Determinants of Health   Financial Resource Strain: Not on file  Food Insecurity: Not on file  Transportation Needs: Not on file  Physical Activity: Not on file  Stress: Not on file  Social Connections: Not on file  Intimate Partner Violence: Not on file    Review of Systems:    Constitutional: No weight loss, fever or chills Cardiovascular: No chest pain Respiratory: No SOB  Gastrointestinal: See HPI and otherwise negative   Physical Exam:  Vital signs: BP 112/62   Pulse 65   Ht 5\' 2"  (1.575 m)   Wt 150 lb (68 kg)   SpO2 96%   BMI 27.44 kg/m    Constitutional:   Pleasant Hispanic female appears to be in NAD, Well developed, Well nourished, alert and cooperative Respiratory: Respirations even and unlabored. Lungs clear to auscultation bilaterally.   No wheezes, crackles, or rhonchi.  Cardiovascular: Normal S1, S2. No MRG. Regular rate and rhythm. No peripheral edema, cyanosis or pallor.  Gastrointestinal:  Soft, nondistended, nontender. No rebound or guarding. Normal bowel sounds. No appreciable masses or hepatomegaly. Psychiatric: Oriented to person, place and time. Demonstrates good judgement and reason without abnormal affect or behaviors.  RELEVANT LABS AND IMAGING: CBC    Component Value Date/Time   WBC 4.6 10/08/2021 0952   WBC 4.6 02/25/2014 0936   RBC 4.21 10/08/2021 0952   RBC 4.18 02/25/2014 0936   HGB 13.5 10/08/2021 0952   HCT 38.5 10/08/2021 0952   PLT 142 (L) 10/08/2021 0952   MCV 91 10/08/2021 0952   MCH 32.1 10/08/2021 0952   MCH 31.1 02/25/2014 0936   MCHC 35.1 10/08/2021 0952   MCHC 34.1 02/25/2014 0936   RDW 12.6 10/08/2021 0952   LYMPHSABS 1.5 10/08/2021 0952   MONOABS 0.4 02/25/2014 0936   EOSABS 0.1 10/08/2021 0952   BASOSABS 0.0 10/08/2021 0952    CMP     Component Value Date/Time   NA 143 10/08/2021 0952   K 3.5 10/08/2021 0952   CL 103 10/08/2021 0952   CO2 24 10/08/2021 0952   GLUCOSE 66 (L) 10/08/2021 0952   GLUCOSE 98 04/11/2013 1631   BUN 11 10/08/2021 0952   CREATININE 0.59 10/08/2021 0952   CREATININE 0.62 04/11/2013 1631   CALCIUM 9.4 10/08/2021 0952   PROT 7.0 10/08/2021 0952   ALBUMIN 4.4 10/08/2021 0952   AST 21 10/08/2021 0952   ALT 16 10/08/2021 0952   ALKPHOS 93 10/08/2021 0952   BILITOT 0.3 10/08/2021 0952   GFRNONAA 108 05/01/2019 1700   GFRNONAA >89 04/11/2013 1631   GFRAA 125 05/01/2019 1700   GFRAA >89 04/11/2013 1631    Assessment: 1.  Epigastric pain/GERD/nausea: Better after 2 months of Omeprazole 40 mg daily, EGD "years ago" with gastritis, again  symptoms relieved now  Plan: 1.  We will decrease Omeprazole to 20 mg daily for the next month, sent in a prescription #30 with 11 refills. 2.  Did discuss with patient that she could trial stopping this medication after another month if she is doing well, otherwise we will continue daily. 3.  Patient to follow in clinic in a year or sooner if necessary.  Ellouise Newer, PA-C [facility] 10/04/2022, 2:33  PM  Cc: Dorethea Clan, DO

## 2022-10-04 NOTE — Patient Instructions (Addendum)
We have sent the following medications to your pharmacy for you to pick up at your convenience: Omeprazole 40 mg take 20 mg daily for another month and then discontinue if you can if not then continue to take them daily.  Follow up as needed _______________________________________________________  If your blood pressure at your visit was 140/90 or greater, please contact your primary care physician to follow up on this.  _______________________________________________________  If you are age 46 or older, your body mass index should be between 23-30. Your Body mass index is 27.44 kg/m. If this is out of the aforementioned range listed, please consider follow up with your Primary Care Provider.  If you are age 38 or younger, your body mass index should be between 19-25. Your Body mass index is 27.44 kg/m. If this is out of the aformentioned range listed, please consider follow up with your Primary Care Provider.   ________________________________________________________  The [facility] providers would like to encourage you to use [facility] to communicate with providers for non-urgent requests or questions.  Due to long hold times on the telephone, sending your provider a message by [facility] may be a faster and more efficient way to get a response.  Please allow 48 business hours for a response.  Please remember that this is for non-urgent requests.  _______________________________________________________

## 2022-10-04 NOTE — Addendum Note (Signed)
Addended by: Caryl Bis D on: 10/04/2022 02:45 PM   Modules accepted: Orders

## 2022-10-06 NOTE — Progress Notes (Signed)
Attending Physician's Attestation   I have reviewed the chart.   I agree with the Advanced Practitioner's note, impression, and recommendations with any updates as below. I am glad to hear that she is doing better.  Hopefully will be able to down titrate medications off.  If she continues to have recurring issues, we will need to consider updated endoscopy.   Justice Britain, MD [facility] Advanced Endoscopy Office # [phone]

## 2022-10-11 DIAGNOSIS — Z1231 Encounter for screening mammogram for malignant neoplasm of breast: Secondary | ICD-10-CM | POA: Diagnosis not present

## 2022-12-01 ENCOUNTER — Ambulatory Visit: Payer: BC Managed Care – PPO

## 2022-12-01 VITALS — BP 104/65 | HR 67 | Temp 97.9°F | Wt 146.2 lb

## 2022-12-01 DIAGNOSIS — Z1159 Encounter for screening for other viral diseases: Secondary | ICD-10-CM | POA: Diagnosis not present

## 2022-12-01 DIAGNOSIS — R7303 Prediabetes: Secondary | ICD-10-CM

## 2022-12-01 DIAGNOSIS — Z0000 Encounter for general adult medical examination without abnormal findings: Secondary | ICD-10-CM | POA: Diagnosis not present

## 2022-12-01 DIAGNOSIS — R1084 Generalized abdominal pain: Secondary | ICD-10-CM | POA: Diagnosis not present

## 2022-12-01 LAB — GLUCOSE, CAPILLARY: Glucose-Capillary: 107 mg/dL — ABNORMAL HIGH (ref 70–99)

## 2022-12-01 LAB — POCT GLYCOSYLATED HEMOGLOBIN (HGB A1C): Hemoglobin A1C: 6.3 % — AB (ref 4.0–5.6)

## 2022-12-01 MED ORDER — POLYETHYLENE GLYCOL 3350 17 G PO PACK: 17.0000 g | PACK | Freq: Every day | ORAL | 0 refills | Status: AC

## 2022-12-01 NOTE — Assessment & Plan Note (Addendum)
Patient developed abdominal pain that began yesterday after waking up. She reports that she has experienced this pain intermittently over the last year. She states that the pain can last up to 1 day. Patient reports that the pain is diffuse but hurts the most in her LLQ. Patient reports that the pain worsens with eating occasionally. Patient reports that she often has constipation and abdominal bloating that coincides with this pain. Her last bowel movement was yesterday and she has bowel movements daily. Patient reports that she is occasionally having darker stools that are small and hard. Denies hematochezia. Patient states that they have tried tylenol at home with minimal relief in their symptoms. She has not tried stool softeners. Denies improvement w/ previously prescribed omeprazole. She does report having sour taste in her mouth as well but denies burning chest pain. Patient occasionally feels nausea and chills with her pain. Patient denies fever, vomiting, diarrhea, dysuria, urinary frequency.   Of note, patient was seen in clinic in 09/2021 for LUQ abdominal pain associated w/ fever, chills, body aches, nausea, and sour taste in her mouth. Differential at that time was GERD vs gastritis. CBC, CMP, lipase, beta hcg, H. Pylori stool Ag, LFTs were obtained. Labs were all unremarkable. Patient was started on omeprazole 40 mg daily and famotidine 40 mg PRN. Patient had normal colonoscopy in 01/2022.   Patient was seen by GI on 3/18 with plan to decrease omeprazole to 20 mg daily and if having recurrent symptoms to have endoscopy done. Patient was instructed to finish her bottle of omeprazole 40 mg daily until she ran out and then start 20 mg daily. She is currently taking 40 mg daily.  Low suspicion for diverticulitis, inflammatory bowel disease, UTI, pregnancy, appendicitis, cholelithiasis at this time. No rebound tenderness or guarding to suggest peritonitis. Suspect symptoms may be secondary to a  combination of GERD vs PUD and constipation. Will continue omeprazole 20 mg daily and start miralax.   Plan: - Omeprazole 20 mg daily  - Start miralax daily - Continue f/u w/ GI

## 2022-12-01 NOTE — Assessment & Plan Note (Signed)
Patient is not currently on any medications for this condition. Patient denies polyuria, polydipsia, fatigue. A1c was 5.9% 1 year ago.   Plan: - A1c today

## 2022-12-01 NOTE — Assessment & Plan Note (Signed)
Hep C screening and lipid panel obtained today.

## 2022-12-01 NOTE — Patient Instructions (Addendum)
Thank you for coming to see [name] in clinic Kelly Atkinson.  Plan:  - Please start taking:     - Miralax 17 g daily for constipation  - Please continue taking:     - Omeprazole 20 mg daily for heartburn  - We got the following labs today and will call you with the results:     - A1c to check your blood sugar  - Lipid panel to check your cholesterol - Hepatis C screening   - Please follow up with your gastroenterologist (stomach doctor) if you continue to have abdominal pain.   [name]   -Phone: [phone]  -Address: [address], [place], [place] [address]    It was very nice to see you, thank you for allowing [name] to be involved in your care. We look forward to seeing you next time. Please call our clinic at [phone] if you have any questions or concerns. The best time to call is Monday-Friday from 9am-4pm, but there is someone available 24/7. If after hours or the weekend, call the main hospital number at [phone] and ask for the Internal Medicine Resident On-Call. If you need medication refills, please notify your pharmacy one week in advance and they will send [name] a request.   Please make sure to arrive 15 minutes prior to your next appointment. If you arrive late, you may be asked to reschedule.  ----------------------------------------------------------------------------- Gracias por venir a vernos a [name].  Plan:  - Por favor comience a tomar:  - Miralax 17 g diarios para el estreimiento  - Contine tomando:  - Omeprazol 20 mg al da para la acidez de Teaching laboratory technician.  - Recibimos los siguientes laboratorios hoy y lo llamaremos con los resultados:  - A1c para controlar su nivel de Production assistant, radio - Panel de lpidos para controlar tu colesterol. - Deteccin de hepatitis C  - Haga un seguimiento con su gastroenterlogo (mdico del estmago) si contina teniendo dolor abdominal. [name]: [phone] -Direccin: [address] [place], [place], [place] [address]    Fue muy agradable verte, gracias por permitirnos involucrarnos en tu cuidado. Esperamos verte la prxima vez. Llame a nuestra clnica al [phone] si tiene alguna pregunta o inquietud. El mejor momento para llamar es de lunes a viernes de 9 a. m. a 4 p. m., pero hay alguien disponible las 24 horas, los 7 [address]  [address] de la [place]. Si es fuera del horario de atencin o durante el fin de [place], [place] al nmero principal del hospital al [phone] y pregunte por el residente de guardia de medicina interna. Si necesita reabastecimiento de medicamentos, notifique a su farmacia con una semana de anticipacin y ellos nos enviarn una solicitud.   Asegrese de llegar 15 minutos antes de su prxima cita. Si llega tarde, es posible que le soliciten reprogramar su cita.

## 2022-12-01 NOTE — Progress Notes (Signed)
CC: abdominal pain  HPI:  [name] is a 47 y.o. female with past medical history of Pre-DM, GERD, insomnia that presents for abdominal pain.    Allergies as of 12/01/2022       Reactions   Ibuprofen Anaphylaxis        Medication List        Accurate as of Dec 01, 2022  4:58 PM. If you have any questions, ask your nurse or doctor.          STOP taking these medications    famotidine 20 MG tablet Commonly known as: PEPCID Stopped by: Karoline Caldwell, MD       TAKE these medications    ciclopirox 8 % solution Commonly known as: Penlac Apply topically at bedtime. Apply over nail and surrounding skin. Apply daily over previous coat. After seven (7) days, may remove with alcohol and continue cycle.   multivitamin tablet Take 1 tablet by mouth daily.   omeprazole 20 MG capsule Commonly known as: PRILOSEC Take 1 capsule (20 mg total) by mouth daily. What changed: Another medication with the same name was removed. Continue taking this medication, and follow the directions you see here. Changed by: Karoline Caldwell, MD   polyethylene glycol 17 g packet Commonly known as: MiraLax Take 17 g by mouth daily. Started by: Karoline Caldwell, MD   PROBIOTIC DAILY PO Take by mouth.         Past Medical History:  Diagnosis Date   Anemia    Asthma    as a child   GERD (gastroesophageal reflux disease)    Low back pain    Low blood pressure    Pre-diabetes    Review of Systems:  per HPI.   Physical Exam: Vitals:   12/01/22 1453  BP: 104/65  Pulse: 67  Temp: 97.9 F (36.6 C)  TempSrc: Oral  SpO2: 100%  Weight: 146 lb 3.2 oz (66.3 kg)   Constitutional: Well-developed, well-nourished, appears comfortable  HENT: Normocephalic and atraumatic.  Eyes: EOM are normal. PERRL.  Neck: Normal range of motion.  Cardiovascular: Regular rate, regular rhythm. No murmurs, rubs, or gallops. Normal radial and PT pulses bilaterally. No LE edema.  Pulmonary: Normal  respiratory effort. No wheezes, rales, or rhonchi.   Abdominal: Soft. Non-distended. Mild tenderness to palpation of the LLQ, suprapubic region, and RLQ. No rebound tenderness or guarding. Normal bowel sounds. Musculoskeletal: Normal range of motion.     Neurological: Alert and oriented to person, place, and time. Non-focal. Skin: warm and dry.    Assessment & Plan:   See Encounters Tab for problem based charting.  Abdominal pain Patient developed abdominal pain that began yesterday after waking up. She reports that she has experienced this pain intermittently over the last year. She states that the pain can last up to 1 day. Patient reports that the pain is diffuse but hurts the most in her LLQ. Patient reports that the pain worsens with eating occasionally. Patient reports that she often has constipation and abdominal bloating that coincides with this pain. Her last bowel movement was yesterday and she has bowel movements daily. Patient reports that she is occasionally having darker stools that are small and hard. Denies hematochezia. Patient states that they have tried tylenol at home with minimal relief in their symptoms. She has not tried stool softeners. Denies improvement w/ previously prescribed omeprazole. She does report having sour taste in her mouth as well but denies burning chest pain. Patient occasionally feels nausea  and chills with her pain. Patient denies fever, vomiting, diarrhea, dysuria, urinary frequency.   Of note, patient was seen in clinic in 09/2021 for LUQ abdominal pain associated w/ fever, chills, body aches, nausea, and sour taste in her mouth. Differential at that time was GERD vs gastritis. CBC, CMP, lipase, beta hcg, H. Pylori stool Ag, LFTs were obtained. Labs were all unremarkable. Patient was started on omeprazole 40 mg daily and famotidine 40 mg PRN. Patient had normal colonoscopy in 01/2022.   Patient was seen by GI on 3/18 with plan to decrease omeprazole to 20 mg  daily and if having recurrent symptoms to have endoscopy done. Patient was instructed to finish her bottle of omeprazole 40 mg daily until she ran out and then start 20 mg daily. She is currently taking 40 mg daily.  Low suspicion for diverticulitis, inflammatory bowel disease, UTI, pregnancy, appendicitis, cholelithiasis at this time. No rebound tenderness or guarding to suggest peritonitis. Suspect symptoms may be secondary to a combination of GERD vs PUD and constipation. Will continue omeprazole 20 mg daily and start miralax.   Plan: - Omeprazole 20 mg daily  - Start miralax daily - Continue f/u w/ GI  Pre-diabetes Patient is not currently on any medications for this condition. Patient denies polyuria, polydipsia, fatigue. A1c was 5.9% 1 year ago.   Plan: - A1c today   Healthcare maintenance Hep C screening and lipid panel obtained today.     Patient seen with Dr. Lafonda Mosses.

## 2022-12-02 LAB — HCV AB W REFLEX TO QUANT PCR: HCV Ab: NONREACTIVE

## 2022-12-02 LAB — LIPID PANEL
Chol/HDL Ratio: 1.8 ratio (ref 0.0–4.4)
Cholesterol, Total: 123 mg/dL (ref 100–199)
HDL: 67 mg/dL (ref >39)
LDL Chol Calc (NIH): 42 mg/dL (ref 0–99)
Triglycerides: 66 mg/dL (ref 0–149)
VLDL Cholesterol Cal: 14 mg/dL (ref 5–40)

## 2022-12-02 LAB — HCV INTERPRETATION

## 2022-12-02 NOTE — Progress Notes (Signed)
Internal Medicine Clinic Attending  Case discussed with Dr. Mapp  At the time of the visit.  We reviewed the resident's history and exam and pertinent patient test results.  I agree with the assessment, diagnosis, and plan of care documented in the resident's note.  

## 2022-12-06 NOTE — Progress Notes (Signed)
Called and updated patient on results. Discussed that her A1c is in the pre-diabetic range. Discussed options for dietary modification and pharmacotherapy. Patient would prefer to try dietary modification at this time. Will repeat A1c in 6 months and further discuss options for pharmacotherapy.

## 2022-12-29 NOTE — Progress Notes (Deleted)
CC: Abdominal pain f/u  HPI:  [name] is a 47 y.o. female with past medical history of  Pre-DM, GERD, insomnia that presents for f/u abdominal pain.    Allergies as of 12/29/2022       Reactions   Ibuprofen Anaphylaxis        Medication List        Accurate as of December 29, 2022  8:07 AM. If you have any questions, ask your nurse or doctor.          ciclopirox 8 % solution Commonly known as: Penlac Apply topically at bedtime. Apply over nail and surrounding skin. Apply daily over previous coat. After seven (7) days, may remove with alcohol and continue cycle.   multivitamin tablet Take 1 tablet by mouth daily.   omeprazole 20 MG capsule Commonly known as: PRILOSEC Take 1 capsule (20 mg total) by mouth daily.   polyethylene glycol 17 g packet Commonly known as: MiraLax Take 17 g by mouth daily.   PROBIOTIC DAILY PO Take by mouth.         Past Medical History:  Diagnosis Date   Anemia    Asthma    as a child   GERD (gastroesophageal reflux disease)    Low back pain    Low blood pressure    Pre-diabetes    Review of Systems:  per HPI.   Physical Exam: *** There were no vitals filed for this visit. *** Constitutional: Well-developed, well-nourished, appears comfortable  HENT: Normocephalic and atraumatic.  Eyes: EOMI. PERRL***.  Neck: Normal range of motion.  Cardiovascular: Regular rate, regular rhythm. No murmurs, rubs, or gallops. Normal radial and PT pulses bilaterally. No LE edema.  Pulmonary: Normal respiratory effort. No wheezes, rales, rhonchi, or crackles.   Abdominal: Soft. Non-distended. No tenderness. Normal bowel sounds.  Musculoskeletal: Normal gait. Normal range of motion***.     Neurological: Alert and oriented to person, place, and time. Non-focal. Skin: warm and dry.    Assessment & Plan:   Abdominal pain Patient has presented to clinic for abdominal pain during multiple visits. In 09/2021, patient had  unremarkable CBC, CMP, lipase, beta hcg, H. Pylori stool Ag, LFTs. She was started on omeprazole 40 mg daily and famotidine 40 mg PRN for suspected GERD vs gastritis. Patient had normal colonoscopy in 01/2022. She was seen again in clinic for abdominal pain on 5/15. Patient reported intermittent diffuse and LLQ abdominal pain over the last year. She stated that the pain worsened with eating occasionally. Also reported constipation, abdominal bloating, nausea, and chills during her episodes of pain. She had recently been seen by GI on 3/18 with  plan to decrease omeprazole to 20 mg daily and if having recurrent symptoms to have endoscopy done. In clinic, we had low suspicion for diverticulitis, inflammatory bowel disease, UTI, pregnancy, appendicitis, cholelithiasis at this time. Had no signs of peritonitis on exam***. Suspected symptoms may be secondary to a combination of GERD vs PUD and constipation. Plan was to continue  omeprazole 20 mg daily and start miralax. Symptoms today***.    Plan: - Continue Omeprazole 20 mg daily and miralax*** - Continue f/u w/ GI   2. Pre-DM Patient has a history of Pre-DM. Currently diet controlled. Patient denies polyuria, polydipsia, fatigue. A1c was 6.3% 1 month ago.   Plan: - Continue dietary modification*** - Repeat A1c in 5-11 months  3.   Plan: - Continue   5. Health Screening: ***tdap -  - Medication refill? ***  See Encounters Tab for problem based charting.  No problem-specific Assessment & Plan notes found for this encounter.    Patient seen with Dr. Amadeo Garnet

## 2023-02-04 ENCOUNTER — Ambulatory Visit (INDEPENDENT_AMBULATORY_CARE_PROVIDER_SITE_OTHER): Payer: BC Managed Care – PPO | Admitting: Internal Medicine

## 2023-02-04 VITALS — BP 96/64 | HR 56 | Temp 97.8°F | Ht 62.0 in | Wt 145.0 lb

## 2023-02-04 DIAGNOSIS — R21 Rash and other nonspecific skin eruption: Secondary | ICD-10-CM

## 2023-02-04 MED ORDER — HYDROXYZINE HCL 25 MG PO TABS
25.0000 mg | ORAL_TABLET | Freq: Every evening | ORAL | 0 refills | Status: DC | PRN
Start: 2023-02-04 — End: 2023-02-04

## 2023-02-04 MED ORDER — HYDROXYZINE HCL 25 MG PO TABS
25.0000 mg | ORAL_TABLET | Freq: Every evening | ORAL | 0 refills | Status: AC | PRN
Start: 2023-02-04 — End: ?

## 2023-02-04 MED ORDER — BENADRYL ITCH STOPPING 1-0.1 % EX CREA: TOPICAL_CREAM | Freq: Three times a day (TID) | CUTANEOUS | 0 refills | Status: AC | PRN

## 2023-02-04 MED ORDER — CALAMINE EX LOTN: 1.0000 | TOPICAL_LOTION | CUTANEOUS | 0 refills | Status: AC | PRN

## 2023-02-04 NOTE — Patient Instructions (Signed)
Thank you, [name] for allowing [name] to provide your care today.  Rash I recommend you start using calamine lotion and benadryl lotion to help with itching. I also sent a tablet it that you can take at night to help get relief form itching. Please make an appointment for 2 weeks. If rash is gone then you do not have to follow-up. Please call if it worsens.  I have ordered the following medication/changed the following medications:   Stop the following medications: There are no discontinued medications.   Start the following medications: Meds ordered this encounter  Medications   calamine lotion    Sig: Apply 1 Application topically as needed for itching.    Dispense:  120 mL    Refill:  0   diphenhydrAMINE-zinc acetate (BENADRYL ITCH STOPPING) cream    Sig: Apply topically 3 (three) times daily as needed for itching.    Dispense:  28.3 g    Refill:  0   hydrOXYzine (ATARAX) 25 MG tablet    Sig: Take 1 tablet (25 mg total) by mouth at bedtime as needed.    Dispense:  30 tablet    Refill:  0     Follow up:  2 weeks    We look forward to seeing you next time. Please call our clinic at [phone] if you have any questions or concerns. The best time to call is Monday-Friday from 9am-4pm, but there is someone available 24/7. If after hours or the weekend, call the main hospital number and ask for the Internal Medicine Resident On-Call. If you need medication refills, please notify your pharmacy one week in advance and they will send [name] a request.   Thank you for trusting me with your care. Wishing you the best!   Rudene Christians, DO [facility]

## 2023-02-04 NOTE — Assessment & Plan Note (Addendum)
4 day history of rash on anterior shin. She noted this after watering her plants. She did not notice any insects and thought it was a bug bite. Initially rash ws small red dot that was itchy and has enlarged over last few days. She has itching that is keeping her up at night. She tried benadryl cream without relief. A: Rash is interesting as has papules that could be consistent with flea or chigger bites but would not explain the larger macule present. Macule does not appear necrotic concerning for spider bite. There are no other rashes present on legs, so I do not think this is erythema nodosa. No signs of superimposed bacterial infection. P: Calamine lotion Benadryl cream Hydroxyzine 25 mg at bedtime F/u in 2 weeks

## 2023-02-04 NOTE — Progress Notes (Signed)
Subjective:  CC: bug bite  HPI:  Ms.Marcelyn Silva-Gutierrez is a 47 y.o. female with a past medical history stated below and presents today for bug bite. This started on Monday as small red dot that was itchy. She waters her plants nightly but does not have woods close to home. No pets in home. 3 outside cats that she does not go near.   Please see problem based assessment and plan for additional details.  Past Medical History:  Diagnosis Date   Anemia    Asthma    as a child   GERD (gastroesophageal reflux disease)    Low back pain    Low blood pressure    Pre-diabetes     Current Outpatient Medications on File Prior to Visit  Medication Sig Dispense Refill   ciclopirox (PENLAC) 8 % solution Apply topically at bedtime. Apply over nail and surrounding skin. Apply daily over previous coat. After seven (7) days, may remove with alcohol and continue cycle. 6.6 mL 0   Multiple Vitamin (MULTIVITAMIN) tablet Take 1 tablet by mouth daily.     omeprazole (PRILOSEC) 20 MG capsule Take 1 capsule (20 mg total) by mouth daily. 30 capsule 11   polyethylene glycol (MIRALAX) 17 g packet Take 17 g by mouth daily. 30 each 0   Probiotic Product (PROBIOTIC DAILY PO) Take by mouth.     No current facility-administered medications on file prior to visit.    Family History  Problem Relation Age of Onset   Diabetes Mother    Hypertension Mother    Stomach cancer Mother    Stroke Father        Died    Colon cancer Neg Hx    Colon polyps Neg Hx    Esophageal cancer Neg Hx    Rectal cancer Neg Hx     Social History   Socioeconomic History   Marital status: Single    Spouse name: Not on file   Number of children: 2   Years of education: Not on file   Highest education level: Not on file  Occupational History   Occupation: Horticulturist, commercial: Biochemist, clinical   Occupation: Nutritional therapist: Biochemist, clinical  Tobacco Use   Smoking status: Never   Smokeless  tobacco: Never  Advertising account planner   Vaping status: Never Used  Substance and Sexual Activity   Alcohol use: No    Alcohol/week: 0.0 standard drinks of alcohol   Drug use: No   Sexual activity: Not Currently  Other Topics Concern   Not on file  Social History Narrative   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  She is standing and does not have to slouch or bend her back while working.  She has been working at [employer] for 4 years.   Social Determinants of Health   Financial Resource Strain: Not on file  Food Insecurity: Not on file  Transportation Needs: Not on file  Physical Activity: Not on file  Stress: Not on file  Social Connections: Not on file  Intimate Partner Violence: Not on file    Review of Systems: ROS negative except for what is noted on the assessment and plan.  Objective:   Vitals:   02/04/23 1051 02/04/23 1127  BP: (!) 97/45 96/64  Pulse: (!) 56   Temp: 97.8 F (36.6 C)   TempSrc: Oral   SpO2: 99%   Weight: 145 lb (65.8 kg)  Height: 5\' 2"  (1.575 m)     Physical Exam: Constitutional: well-appearing  MSK: normal bulk and tone Neurological: alert & oriented x 3, 5/5 strength in bilateral upper and lower extremities, normal gait Skin: multiple papules to right anterior shin, non circumscribed non flat macule present      Assessment & Plan:  Rash 4 day history of rash on anterior shin. She noted this after watering her plants. She did not notice any insects and thought it was a bug bite. Initially rash ws small red dot that was itchy and has enlarged over last few days. She has itching that is keeping her up at night. She tried benadryl cream without relief. A: Rash is interesting as has papules that could be consistent with flea or chigger bites but would not explain the larger macule present. Macule does not appear necrotic concerning for spider bite. There are no other rashes present on legs, so I do not think this is erythema  nodosa. No signs of superimposed bacterial infection. P: Calamine lotion Benadryl cream Hydroxyzine 25 mg at bedtime F/u in 2 weeks    Patient discussed with Dr. Cleda Daub   Carie Kapuscinski, D.O. [facility]  PGY-3 Pager: [phone]  Phone: [phone] Date 02/04/2023  Time 5:31 PM

## 2023-02-09 NOTE — Progress Notes (Signed)
Internal Medicine Clinic Attending  I was physically present during the key portions of the resident provided service and participated in the medical decision making of patient's management care. I reviewed pertinent patient test results.  The assessment, diagnosis, and plan were formulated together and I agree with the documentation in the resident's note.  Gust Rung, DO

## 2023-02-21 ENCOUNTER — Encounter: Payer: BC Managed Care – PPO | Admitting: Student

## 2023-10-18 DIAGNOSIS — Z1231 Encounter for screening mammogram for malignant neoplasm of breast: Secondary | ICD-10-CM | POA: Diagnosis not present

## 2023-10-18 DIAGNOSIS — Z01419 Encounter for gynecological examination (general) (routine) without abnormal findings: Secondary | ICD-10-CM | POA: Diagnosis not present

## 2023-10-18 DIAGNOSIS — Z1331 Encounter for screening for depression: Secondary | ICD-10-CM | POA: Diagnosis not present

## 2023-10-18 LAB — HM MAMMOGRAPHY

## 2024-02-06 ENCOUNTER — Ambulatory Visit

## 2024-02-06 ENCOUNTER — Other Ambulatory Visit: Payer: Self-pay

## 2024-02-06 VITALS — BP 101/64 | HR 65 | Temp 98.3°F | Ht 62.0 in | Wt 143.0 lb

## 2024-02-06 DIAGNOSIS — L255 Unspecified contact dermatitis due to plants, except food: Secondary | ICD-10-CM | POA: Diagnosis not present

## 2024-02-06 DIAGNOSIS — L259 Unspecified contact dermatitis, unspecified cause: Secondary | ICD-10-CM | POA: Insufficient documentation

## 2024-02-06 MED ORDER — TRIAMCINOLONE ACETONIDE 0.5 % EX OINT: 1.0000 | TOPICAL_OINTMENT | Freq: Two times a day (BID) | CUTANEOUS | 0 refills | Status: AC

## 2024-02-06 MED ORDER — CETIRIZINE HCL 10 MG PO TABS: 10.0000 mg | ORAL_TABLET | Freq: Every day | ORAL | 0 refills | Status: AC

## 2024-02-06 NOTE — Progress Notes (Signed)
 CC: Bilateral forearm rash  HPI:  Kelly Atkinson is a 48 y.o. female living with a history stated below and presents today for bilateral forearm rash started 2 days ago. Please see problem based assessment and plan for additional details.  Past Medical History:  Diagnosis Date   Anemia    Asthma    as a child   GERD (gastroesophageal reflux disease)    Low back pain    Low blood pressure    Pre-diabetes     Current Outpatient Medications on File Prior to Visit  Medication Sig Dispense Refill   calamine lotion Apply 1 Application topically as needed for itching. 120 mL 0   ciclopirox  (PENLAC ) 8 % solution Apply topically at bedtime. Apply over nail and surrounding skin. Apply daily over previous coat. After seven (7) days, may remove with alcohol and continue cycle. 6.6 mL 0   diphenhydrAMINE -zinc acetate (BENADRYL  ITCH STOPPING) cream Apply topically 3 (three) times daily as needed for itching. 28.3 g 0   hydrOXYzine  (ATARAX ) 25 MG tablet Take 1 tablet (25 mg total) by mouth at bedtime as needed. 10 tablet 0   Multiple Vitamin (MULTIVITAMIN) tablet Take 1 tablet by mouth daily.     omeprazole  (PRILOSEC ) 20 MG capsule Take 1 capsule (20 mg total) by mouth daily. 30 capsule 11   polyethylene glycol (MIRALAX ) 17 g packet Take 17 g by mouth daily. 30 each 0   Probiotic Product (PROBIOTIC DAILY PO) Take by mouth.     No current facility-administered medications on file prior to visit.    Family History  Problem Relation Age of Onset   Diabetes Mother    Hypertension Mother    Stomach cancer Mother    Stroke Father        Died    Colon cancer Neg Hx    Colon polyps Neg Hx    Esophageal cancer Neg Hx    Rectal cancer Neg Hx     Social History   Socioeconomic History   Marital status: Single    Spouse name: Not on file   Number of children: 2   Years of education: Not on file   Highest education level: Not on file  Occupational History   Occupation: Engineering geologist: Biochemist, clinical   Occupation: Nutritional therapist: Biochemist, clinical  Tobacco Use   Smoking status: Never   Smokeless tobacco: Never  Advertising account planner   Vaping status: Never Used  Substance and Sexual Activity   Alcohol use: No    Alcohol/week: 0.0 standard drinks of alcohol   Drug use: No   Sexual activity: Not Currently  Other Topics Concern   Not on file  Social History Narrative   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  She is standing and does not have to slouch or bend her back while working.  She has been working at [employer] for 4 years.   Social Drivers of Health   Financial Resource Strain: Medium Risk (02/06/2024)   Overall Financial Resource Strain (CARDIA)    Difficulty of Paying Living Expenses: Somewhat hard  Food Insecurity: No Food Insecurity (02/06/2024)   Hunger Vital Sign    Worried About Running Out of Food in the Last Year: Never true    Ran Out of Food in the Last Year: Never true  Transportation Needs: No Transportation Needs (02/06/2024)   PRAPARE - Transportation    Lack of Transportation (  Medical): No    Lack of Transportation (Non-Medical): No  Physical Activity: Not on file  Stress: No Stress Concern Present (02/06/2024)   [employer] of Occupational Health - Occupational Stress Questionnaire    Feeling of Stress: Not at all  Social Connections: Unknown (02/06/2024)   Social Connection and Isolation Panel    Frequency of Communication with Friends and Family: More than three times a week    Frequency of Social Gatherings with Friends and Family: Twice a week    Attends Religious Services: More than 4 times per year    Active Member of [employer] or Organizations: No    Attends Banker Meetings: Never    Marital Status: Not on file  Intimate Partner Violence: Not on file    Review of Systems: ROS negative except for what is noted on the assessment and plan.  Vitals:   02/06/24 1336   BP: 101/64  Pulse: 65  Temp: 98.3 F (36.8 C)  TempSrc: Oral  SpO2: 95%  Weight: 143 lb (64.9 kg)  Height: 5' 2 (1.575 m)    Physical Exam  Physical Exam: Constitutional: well-appearing  in no acute distress HENT: normocephalic atraumatic, mucous membranes moist Eyes: conjunctiva non-erythematous Neck: supple Cardiovascular: regular rate and rhythm, no m/r/g Pulmonary/Chest: normal work of breathing on room air, lungs clear to auscultation bilaterally Abdominal: soft, non-tender, non-distended Neurological: alert & oriented x 3 Skin: Skin intact red papules on bilateral forearm, no vesicular, ulcer Assessment & Plan:   Contact dermatitis The patient presents with a 2-day history of rash on the bilateral forearms and chest, accompanied by itching and burning. She reports recently buying a large plant and holding it with her arms at home. No rash is present on other parts of the body.  Physical exam reveals scattered erythematous papules on both forearms without drainage or discharge.  Assessment: contact dermatitis, likely related to exposure to the plant.  Plan:  Triamcinolone  ointment twice daily Cetirizine  10 mg nightly Advise avoiding further contact with the plant or any potential irritants Follow-up if symptoms worsen or do not improve   Patient seen with Dr. Lovie Armando Rossetti M.D [facility], PGY-1 Phone: [phone] Date 02/06/2024 Time 4:04 PM

## 2024-02-06 NOTE — Assessment & Plan Note (Addendum)
 The patient presents with a 2-day history of rash on the bilateral forearms and chest, accompanied by itching and burning. She reports recently buying a large plant and holding it with her arms at home. No rash is present on other parts of the body. Negative history of new med, food or exposure.   Physical exam reveals scattered erythematous papules on both forearms without drainage or discharge.  Assessment: contact dermatitis, likely related to exposure to the plant.  Plan:  Triamcinolone  ointment twice daily Cetirizine  10 mg nightly Advise avoiding further contact with the plant or any potential irritants Follow-up if symptoms worsen or do not improve

## 2024-02-06 NOTE — Patient Instructions (Addendum)
 Thank you, Ms. Liannah Yarbough, for trusting us  with your care today. During your visit, we thoroughly discussed the rash on your arm, including its appearance, duration, and any associated symptoms such as itching or discomfort. To help manage your condition, we have started treatment with two topical ointments aimed at reducing inflammation and promoting healing, along with an oral antihistamine to help control any itching or allergic reaction.  Please make sure to apply the ointments as directed and take the antihistamine regularly. If you notice any worsening of the rash, new symptoms, or any side effects from the medications, don't hesitate to reach out to us  immediately.  It was a pleasure assisting you today.  I have ordered the following labs for you:  Lab Orders  No laboratory test(s) ordered today     Tests ordered today:   Referrals ordered today:   Referral Orders  No referral(s) requested today     I have ordered the following medication/changed the following medications:   Stop the following medications: There are no discontinued medications.   Start the following medications: Meds ordered this encounter  Medications   triamcinolone  ointment (KENALOG ) 0.5 %    Sig: Apply 1 Application topically 2 (two) times daily.    Dispense:  30 g    Refill:  0   cetirizine  (ZYRTEC  ALLERGY) 10 MG tablet    Sig: Take 1 tablet (10 mg total) by mouth daily for 30 doses.    Dispense:  30 tablet    Refill:  0     Follow up: 1 year   Remember:   Should you have any questions or concerns please call the internal medicine clinic at [phone].    Armando Rossetti, M.D [facility]

## 2024-02-20 NOTE — Addendum Note (Signed)
 Addended by: Jeptha Hinnenkamp L on: 02/20/2024 08:59 AM   Modules accepted: Level of Service

## 2024-02-20 NOTE — Progress Notes (Signed)
Internal Medicine Clinic Attending  I was physically present during the key portions of the resident provided service and participated in the medical decision making of patient's management care. I reviewed pertinent patient test results.  The assessment, diagnosis, and plan were formulated together and I agree with the documentation in the resident's note.  Mercie Eon, MD

## 2024-03-12 ENCOUNTER — Encounter

## 2024-03-21 ENCOUNTER — Ambulatory Visit

## 2024-03-21 VITALS — BP 93/61 | HR 67 | Temp 98.1°F | Ht 62.0 in | Wt 143.0 lb

## 2024-03-21 DIAGNOSIS — Z0000 Encounter for general adult medical examination without abnormal findings: Secondary | ICD-10-CM

## 2024-03-21 DIAGNOSIS — R7303 Prediabetes: Secondary | ICD-10-CM | POA: Diagnosis not present

## 2024-03-21 DIAGNOSIS — R52 Pain, unspecified: Secondary | ICD-10-CM | POA: Diagnosis not present

## 2024-03-21 DIAGNOSIS — D696 Thrombocytopenia, unspecified: Secondary | ICD-10-CM | POA: Diagnosis not present

## 2024-03-21 NOTE — Progress Notes (Signed)
 Established Patient Office Visit  Subjective   Patient ID: Kelly Atkinson, female    DOB: 11/12/1975  Age: 48 y.o. MRN: [id number]  Patient presents for an annual physical. Doing well. Had questions about vitamin C and D supplementation. Patient eats a well diversified diet including animal products such as fish, meat, chicken, milk, cheese, and yogurt. Recommended patient continue eating diverse diet for best vitamin intake.         Objective:     BP 93/61 (BP Location: Left Arm, Patient Position: Sitting, Cuff Size: Normal) Comment: dr aware  Pulse 67   Temp 98.1 F (36.7 C) (Oral)   Ht 5' 2 (1.575 m)   Wt 143 lb (64.9 kg)   SpO2 95%   BMI 26.16 kg/m    Physical Exam Vitals reviewed.  Constitutional:      Appearance: Normal appearance. She is not ill-appearing.  HENT:     Head: Normocephalic and atraumatic.     Nose: Nose normal.     Mouth/Throat:     Mouth: Mucous membranes are moist.     Pharynx: Oropharynx is clear.  Eyes:     General: No scleral icterus.    Conjunctiva/sclera: Conjunctivae normal.  Cardiovascular:     Rate and Rhythm: Normal rate and regular rhythm.     Pulses: Normal pulses.     Heart sounds: Normal heart sounds.  Pulmonary:     Effort: Pulmonary effort is normal.     Breath sounds: Normal breath sounds. No wheezing or rales.  Abdominal:     General: There is no distension.     Palpations: There is no mass.     Tenderness: There is no abdominal tenderness. There is no guarding or rebound.     Hernia: No hernia is present.  Musculoskeletal:        General: No deformity.     Right lower leg: No edema.     Left lower leg: No edema.  Skin:    General: Skin is warm and dry.     Findings: No rash.  Neurological:     General: No focal deficit present.     Mental Status: She is alert and oriented to person, place, and time.     Motor: No weakness.  Psychiatric:        Mood and Affect: Mood normal.        Behavior: Behavior  normal.      No results found for any visits on 03/21/24.    The ASCVD Risk score (Arnett DK, et al., 2019) failed to calculate for the following reasons:   The valid total cholesterol range is 130 to 320 mg/dL    Assessment & Plan:   Assessment & Plan Pre-diabetes Patients last A1c in 11/2022 6.3%, pre-diabetes. At this time decision was made to adjust lifestyle with diet and exercise instead of start medication. Patients weight has not changed much since that last visit but she eats a diverse diet including, eggs, yogurt, salads, meats, and lots of fruit. Patient is worried she eats too much fruit. Denies alcohol and smoking. Only eats out at restaurants on the weekend. She tries to exercise 3-4 times a week, either a 20 minute walk or she goes to the gym and gets on the stair climber. We discussed healthy diet habits and encouraged continued exercise. Patient has a good understanding of what she needs to work on and is motivated.  Plan to recheck A1c today. If the same or improved, will  continue current lifestyle modifications. If greater than or equal to 6.5%, will discuss starting medication such as metformin.  Orders:   Hemoglobin A1c  Thrombocytopenia (HCC) In 09/2021, patient had mildly low platelet count at 142.  In this other wise healthy and asymptomatic 48 year old female, differential includes pseudo thrombocytopenia, benign constitutional thrombocytopenia, mild or early ITP,  Transient postviral thrombocytopenia, or medication induced.  Plan to recheck CBC today. Orders:   CBC no Diff  Soreness-type pain Patient has soreness on her right lower leg, denies trauma, bruising, or impaired ambulation. Only hurts when pressed on. No obvious swelling or rash on exam. Patient also has soreness in right elbow when she works in the yard. Denies trauma or impaired mobility. Normal strength in BL upper extremities. No obvious swelling or bruising on exam. I suspect these are normal  wear-and-tear aches. Recommended rest, ice, heating pads, and acetaminophen as needed. If these aches do not improve or worsen, recommended patient contact us  and we can do further work up.     Healthcare maintenance Patient prefers to get the flu vaccine at [facility] so she can get a coupon.  Sent patient mychart verification message today so that she can send us  a copy of the flu vaccine record once she gets it.   Patient cannot remember if she has had a tetanus booster at the health department recently.  Discussed with patient to verify if she got the booster or not, if she has not gotten it we will give it to her here or she can get it at her local [facility].     Return in about 1 year (around 03/21/2025) for annual appt.    Viktoria King, DO

## 2024-03-21 NOTE — Assessment & Plan Note (Addendum)
 Patients last A1c in 11/2022 6.3%, pre-diabetes. At this time decision was made to adjust lifestyle with diet and exercise instead of start medication. Patients weight has not changed much since that last visit but she eats a diverse diet including, eggs, yogurt, salads, meats, and lots of fruit. Patient is worried she eats too much fruit. Denies alcohol and smoking. Only eats out at restaurants on the weekend. She tries to exercise 3-4 times a week, either a 20 minute walk or she goes to the gym and gets on the stair climber. We discussed healthy diet habits and encouraged continued exercise. Patient has a good understanding of what she needs to work on and is motivated.  Plan to recheck A1c today. If the same or improved, will continue current lifestyle modifications. If greater than or equal to 6.5%, will discuss starting medication such as metformin.  Orders:   Hemoglobin A1c

## 2024-03-21 NOTE — Assessment & Plan Note (Signed)
 Patient prefers to get the flu vaccine at [facility] so she can get a coupon.  Sent patient mychart verification message today so that she can send us  a copy of the flu vaccine record once she gets it.   Patient cannot remember if she has had a tetanus booster at the health department recently.  Discussed with patient to verify if she got the booster or not, if she has not gotten it we will give it to her here or she can get it at her local [facility].

## 2024-03-21 NOTE — Patient Instructions (Addendum)
 It was wonderful seeing you today!   Please remember to...  1) continue eating a healthy diet and doing physical activity 3-5 times a week for 30 minutes or more   2) please confirm with the health department if you need a Tdap booster. If you need one, please call our office and we will schedule an appointment for you to get one.   3) please set up your MyChart with the verification sent to your phone  And don't forget to get your flu shot before the end of October!  If you have any questions please feel free to the call the clinic at anytime at [phone].  Have a blessed day,  Dr. Charmayne  --------------- Terre un placer verte hoy!  Recuerda...  1) Seguir con ignacia dieta saludable y hacer actividad fsica de 3 a 5 veces por semana durante 30 minutos o ms.  2) Confirmar con el departamento de salud si necesitas una dosis de refuerzo de Tdap. Si la necesitas, llama a nuestra oficina y programaremos una cita para que la recibas.  3) Configura tu MyChart con la verificacin enviada a tu telfono.  Y no olvides vacunarte contra la gripe antes de que termine [place]!  Si tienes alguna pregunta, no dudes en llamar a la clnica en cualquier momento al [phone]. Que tengas un buen da, Dr. Charmayne.

## 2024-03-22 ENCOUNTER — Ambulatory Visit: Payer: Self-pay

## 2024-03-22 LAB — CBC
Hematocrit: 39.6 % (ref 34.0–46.6)
Hemoglobin: 12.9 g/dL (ref 11.1–15.9)
MCH: 31.1 pg (ref 26.6–33.0)
MCHC: 32.6 g/dL (ref 31.5–35.7)
MCV: 95 fL (ref 79–97)
Platelets: 154 x10E3/uL (ref 150–450)
RBC: 4.15 x10E6/uL (ref 3.77–5.28)
RDW: 13.2 % (ref 11.7–15.4)
WBC: 5.9 x10E3/uL (ref 3.4–10.8)

## 2024-03-22 LAB — HEMOGLOBIN A1C
Est. average glucose Bld gHb Est-mCnc: 131 mg/dL
Hgb A1c MFr Bld: 6.2 % — ABNORMAL HIGH (ref 4.8–5.6)

## 2024-03-22 NOTE — Progress Notes (Signed)
 Internal Medicine Clinic Attending  Case discussed with the resident at the time of the visit.  We reviewed the resident's history and exam and pertinent patient test results.  I agree with the assessment, diagnosis, and plan of care documented in the resident's note.

## 2024-03-22 NOTE — Progress Notes (Signed)
 A1c in 11/2022 was 6.3%, patient opted to do lifestyle modifications at that time. A1c on 03/21/2024 has improved to 6.2%. Plan discussed yesterday to continue healthy diet and exercising. Will continue.   Platelets were 142 two years ago. Recheck CBC yesterday and wnl at 154.

## 2024-03-22 NOTE — Addendum Note (Signed)
 Addended by: KARNA FELLOWS on: 03/22/2024 11:16 AM   Modules accepted: Level of Service
